# Patient Record
Sex: Male | Born: 1965 | Race: Black or African American | Hispanic: No | Smoking: Current every day smoker
Health system: Southern US, Community
[De-identification: ages and names within clinical notes are randomized; demographics above are authoritative.]

## PROBLEM LIST (undated history)

## (undated) HISTORY — PX: STOMACH SURGERY: SHX791

---

## 2005-09-04 ENCOUNTER — Inpatient Hospital Stay (HOSPITAL_COMMUNITY): Admission: EM | Admit: 2005-09-04 | Discharge: 2005-09-10 | Payer: Self-pay | Admitting: Emergency Medicine

## 2014-02-06 ENCOUNTER — Ambulatory Visit (HOSPITAL_COMMUNITY): Payer: Managed Care, Other (non HMO) | Attending: Family Medicine

## 2014-02-06 ENCOUNTER — Emergency Department (HOSPITAL_COMMUNITY)
Admission: EM | Admit: 2014-02-06 | Discharge: 2014-02-06 | Disposition: A | Discharge status: Home / Self Care | Payer: Managed Care, Other (non HMO) | Source: Home / Self Care | Attending: Family Medicine | Admitting: Family Medicine

## 2014-02-06 ENCOUNTER — Encounter (HOSPITAL_COMMUNITY): Payer: Self-pay | Admitting: Emergency Medicine

## 2014-02-06 DIAGNOSIS — M5489 Other dorsalgia: Secondary | ICD-10-CM | POA: Diagnosis present

## 2014-02-06 DIAGNOSIS — R091 Pleurisy: Secondary | ICD-10-CM

## 2014-02-06 DIAGNOSIS — J918 Pleural effusion in other conditions classified elsewhere: Secondary | ICD-10-CM

## 2014-02-06 MED ORDER — DICLOFENAC POTASSIUM 50 MG PO TABS: 50.0000 mg | ORAL_TABLET | Freq: Three times a day (TID) | ORAL | Status: DC

## 2014-02-06 NOTE — Discharge Instructions (Signed)
Use medicine as needed, return or see your doctor if further problems.

## 2014-02-06 NOTE — ED Provider Notes (Signed)
CSN: [id number]     Arrival date & time 02/06/14  1624 History   First MD Initiated Contact with Patient 02/06/14 1653     Chief Complaint  Patient presents with  . Chest Pain   (Consider location/radiation/quality/duration/timing/severity/associated sxs/prior Treatment) Patient is a 48 y.o. male presenting with chest pain. The history is provided by the patient.  Chest Pain Pain location:  R lateral chest Pain quality: sharp   Pain radiates to:  Mid back Pain radiates to the back: yes   Pain severity:  Moderate Onset quality:  Sudden Duration:  3 days Progression:  Unchanged Chronicity:  New Context: breathing   Relieved by:  None tried Worsened by:  Nothing tried Ineffective treatments:  None tried Associated symptoms: cough   Associated symptoms: no abdominal pain, no lower extremity edema, no palpitations, no PND and no shortness of breath   Risk factors: male sex and smoking     History reviewed. No pertinent past medical history. History reviewed. No pertinent past surgical history. History reviewed. No pertinent family history. History  Substance Use Topics  . Smoking status: Current Every Day Smoker -- 1.00 packs/day    Types: Cigarettes  . Smokeless tobacco: Not on file  . Alcohol Use: 1.2 - 1.8 oz/week    2-3 Cans of beer per week     Comment: daily    Review of Systems  Constitutional: Negative.   Respiratory: Positive for cough. Negative for chest tightness, shortness of breath and wheezing.   Cardiovascular: Positive for chest pain. Negative for palpitations, leg swelling and PND.  Gastrointestinal: Negative for abdominal pain.    Allergies  Review of patient's allergies indicates no known allergies.  Home Medications   Prior to Admission medications   Medication Sig Start Date End Date Taking? Authorizing Provider  diclofenac (CATAFLAM) 50 MG tablet Take 1 tablet (50 mg total) by mouth 3 (three) times daily. For chest pain 02/06/14   Billy Fischer, MD   BP 99/70 mmHg  Pulse 72  Temp(Src) 99.8 F (37.7 C) (Oral)  Resp 18  SpO2 99% Physical Exam  Constitutional: He is oriented to person, place, and time. He appears well-developed and well-nourished. No distress.  Neck: Normal range of motion. Neck supple.  Cardiovascular: Normal rate, regular rhythm, normal heart sounds and intact distal pulses.   Pulmonary/Chest: Effort normal and breath sounds normal. No respiratory distress. He has no wheezes. He exhibits no tenderness.  Abdominal: Soft. Bowel sounds are normal. He exhibits no distension. There is no tenderness. There is no rebound and no guarding.  Lymphadenopathy:    He has no cervical adenopathy.  Neurological: He is alert and oriented to person, place, and time.  Skin: Skin is warm and dry.  Nursing note and vitals reviewed.   ED Course  Procedures (including critical care time) Labs Review Labs Reviewed - No data to display  Imaging Review Dg Chest 2 View  02/06/2014   CLINICAL DATA:  Right upper back pain for 3 days, initial encounter  EXAM: CHEST  2 VIEW  COMPARISON:  None.  FINDINGS: The heart size and mediastinal contours are within normal limits. Both lungs are clear. The visualized skeletal structures are unremarkable.  IMPRESSION: No active cardiopulmonary disease.   Electronically Signed   By: Inez Catalina M.D.   On: 02/06/2014 17:48   X-rays reviewed and report per radiologist.   MDM   1. Viral pleurisy        Billy Fischer, MD 02/06/14  1801 

## 2014-02-06 NOTE — ED Notes (Signed)
Pt states that he has had a difficult time breathing on the right side of his chest for 3 days.

## 2014-02-14 ENCOUNTER — Other Ambulatory Visit (HOSPITAL_COMMUNITY): Payer: Managed Care, Other (non HMO)

## 2014-02-14 ENCOUNTER — Emergency Department (HOSPITAL_COMMUNITY): Payer: Managed Care, Other (non HMO)

## 2014-02-14 ENCOUNTER — Encounter (HOSPITAL_COMMUNITY): Payer: Self-pay | Admitting: *Deleted

## 2014-02-14 ENCOUNTER — Inpatient Hospital Stay (HOSPITAL_COMMUNITY)
Admission: EM | Admit: 2014-02-14 | Discharge: 2014-02-16 | DRG: 194 | Disposition: A | Discharge status: Home / Self Care | Payer: Managed Care, Other (non HMO) | Attending: Internal Medicine | Admitting: Internal Medicine

## 2014-02-14 DIAGNOSIS — J90 Pleural effusion, not elsewhere classified: Secondary | ICD-10-CM

## 2014-02-14 DIAGNOSIS — J189 Pneumonia, unspecified organism: Principal | ICD-10-CM | POA: Diagnosis present

## 2014-02-14 DIAGNOSIS — F17210 Nicotine dependence, cigarettes, uncomplicated: Secondary | ICD-10-CM | POA: Diagnosis present

## 2014-02-14 DIAGNOSIS — R0789 Other chest pain: Secondary | ICD-10-CM

## 2014-02-14 DIAGNOSIS — R079 Chest pain, unspecified: Secondary | ICD-10-CM

## 2014-02-14 DIAGNOSIS — R7989 Other specified abnormal findings of blood chemistry: Secondary | ICD-10-CM

## 2014-02-14 DIAGNOSIS — R05 Cough: Secondary | ICD-10-CM | POA: Diagnosis not present

## 2014-02-14 DIAGNOSIS — J9811 Atelectasis: Secondary | ICD-10-CM | POA: Diagnosis present

## 2014-02-14 DIAGNOSIS — R918 Other nonspecific abnormal finding of lung field: Secondary | ICD-10-CM | POA: Insufficient documentation

## 2014-02-14 DIAGNOSIS — I2510 Atherosclerotic heart disease of native coronary artery without angina pectoris: Secondary | ICD-10-CM | POA: Diagnosis present

## 2014-02-14 LAB — URINALYSIS, ROUTINE W REFLEX MICROSCOPIC
Bilirubin Urine: NEGATIVE
Glucose, UA: NEGATIVE mg/dL
HGB URINE DIPSTICK: NEGATIVE
Ketones, ur: NEGATIVE mg/dL
Nitrite: NEGATIVE
Protein, ur: NEGATIVE mg/dL
SPECIFIC GRAVITY, URINE: 1.038 — AB (ref 1.005–1.030)
UROBILINOGEN UA: 2 mg/dL — AB (ref 0.0–1.0)
pH: 6 (ref 5.0–8.0)

## 2014-02-14 LAB — BASIC METABOLIC PANEL
ANION GAP: 12 (ref 5–15)
BUN: 8 mg/dL (ref 6–23)
CALCIUM: 9.2 mg/dL (ref 8.4–10.5)
CO2: 25 mEq/L (ref 19–32)
Chloride: 101 mEq/L (ref 96–112)
Creatinine, Ser: 0.85 mg/dL (ref 0.50–1.35)
GFR calc Af Amer: >90 mL/min (ref >90)
Glucose, Bld: 132 mg/dL — ABNORMAL HIGH (ref 70–99)
POTASSIUM: 3.9 meq/L (ref 3.7–5.3)
SODIUM: 138 meq/L (ref 137–147)

## 2014-02-14 LAB — RAPID URINE DRUG SCREEN, HOSP PERFORMED
AMPHETAMINES: NOT DETECTED
Barbiturates: NOT DETECTED
Benzodiazepines: NOT DETECTED
Cocaine: NOT DETECTED
OPIATES: NOT DETECTED
Tetrahydrocannabinol: NOT DETECTED

## 2014-02-14 LAB — TROPONIN I

## 2014-02-14 LAB — DIFFERENTIAL
Basophils Absolute: 0 10*3/uL (ref 0.0–0.1)
Basophils Relative: 0 % (ref 0–1)
EOS ABS: 0.3 10*3/uL (ref 0.0–0.7)
Eosinophils Relative: 5 % (ref 0–5)
LYMPHS ABS: 1 10*3/uL (ref 0.7–4.0)
Lymphocytes Relative: 17 % (ref 12–46)
MONOS PCT: 8 % (ref 3–12)
Monocytes Absolute: 0.5 10*3/uL (ref 0.1–1.0)
NEUTROS ABS: 4 10*3/uL (ref 1.7–7.7)
NEUTROS PCT: 70 % (ref 43–77)

## 2014-02-14 LAB — D-DIMER, QUANTITATIVE (NOT AT ARMC): D DIMER QUANT: 1.56 ug{FEU}/mL — AB (ref 0.00–0.48)

## 2014-02-14 LAB — CBC
HEMATOCRIT: 40.5 % (ref 39.0–52.0)
HEMOGLOBIN: 13.6 g/dL (ref 13.0–17.0)
MCH: 32.3 pg (ref 26.0–34.0)
MCHC: 33.6 g/dL (ref 30.0–36.0)
MCV: 96.2 fL (ref 78.0–100.0)
Platelets: 231 10*3/uL (ref 150–400)
RBC: 4.21 MIL/uL — ABNORMAL LOW (ref 4.22–5.81)
RDW: 12.9 % (ref 11.5–15.5)
WBC: 5.6 10*3/uL (ref 4.0–10.5)

## 2014-02-14 LAB — URINE MICROSCOPIC-ADD ON

## 2014-02-14 LAB — PROCALCITONIN: Procalcitonin: 0.5 ng/mL

## 2014-02-14 LAB — STREP PNEUMONIAE URINARY ANTIGEN: STREP PNEUMO URINARY ANTIGEN: NEGATIVE

## 2014-02-14 LAB — I-STAT TROPONIN, ED: Troponin i, poc: 0 ng/mL (ref 0.00–0.08)

## 2014-02-14 LAB — HIV ANTIBODY (ROUTINE TESTING W REFLEX): HIV: NONREACTIVE

## 2014-02-14 IMAGING — CT CT ANGIO CHEST
1 of 8 series · 17 of 36 positions shown · IV contrast (Iohexol (Omnipaque 350))
Comparison: Chest x-ray [DATE], [DATE]

CLINICAL DATA: Right-sided chest pain and back pain

EXAM:
CT ANGIOGRAPHY CHEST WITH CONTRAST
TECHNIQUE: Multidetector CT imaging of the chest was performed using the
standard protocol during bolus administration of intravenous
contrast. Multiplanar CT image reconstructions and MIPs were
obtained to evaluate the vascular anatomy.
CONTRAST:  80mL OMNIPAQUE IOHEXOL 350 MG/ML SOLN

[Series 407: thins pacs · axial · 0.67mm/px · z∈[+34,+303]mm · 17 of 303 slices shown]
[im 17/303  lung]
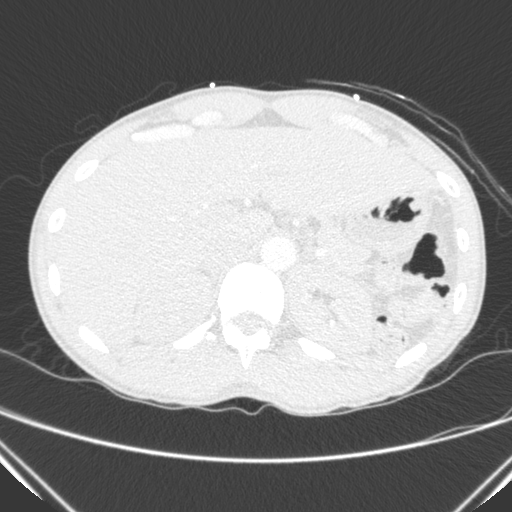
[im 34/303  mediastinal]
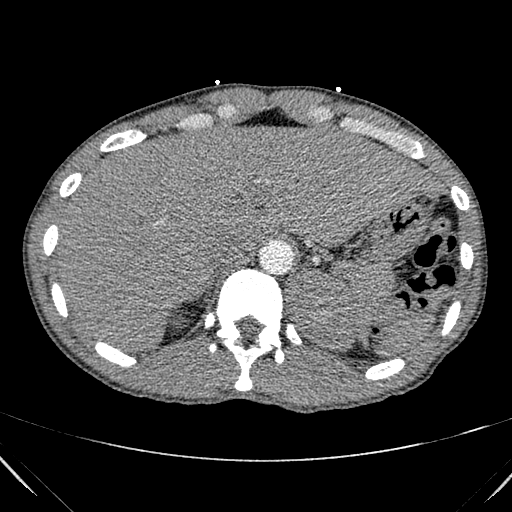
[im 51/303  lung]
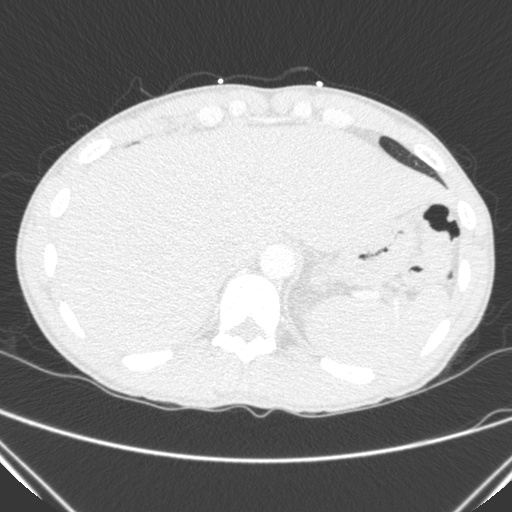
[im 68/303  mediastinal]
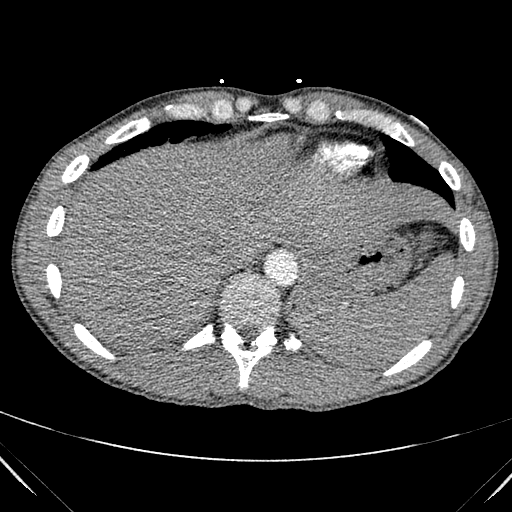
[im 84/303  lung]
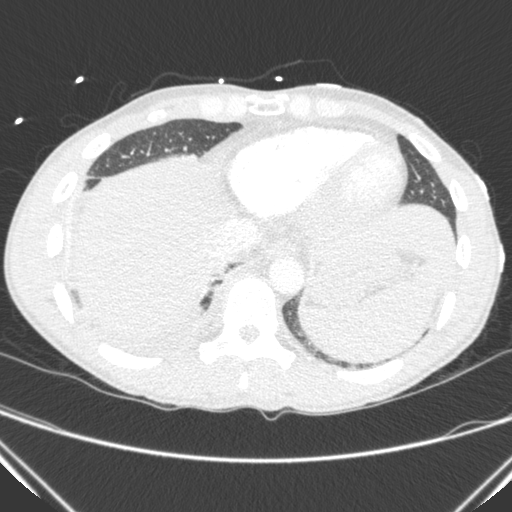
[im 101/303  mediastinal]
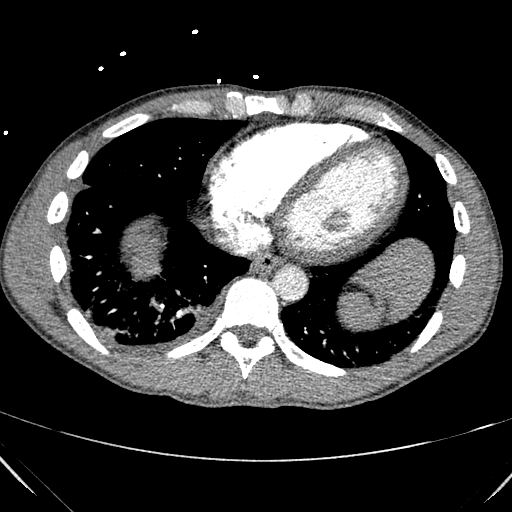
[im 118/303  lung]
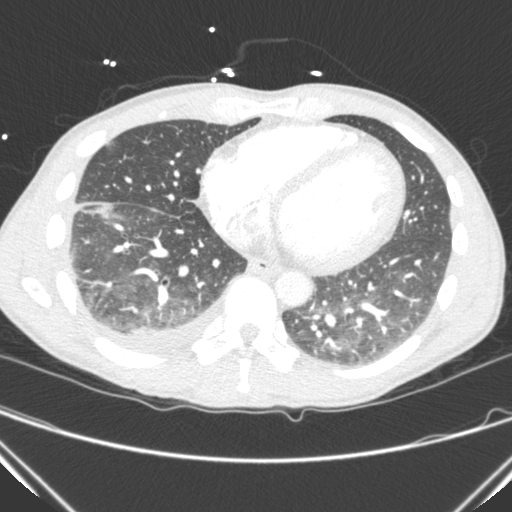
[im 135/303  mediastinal]
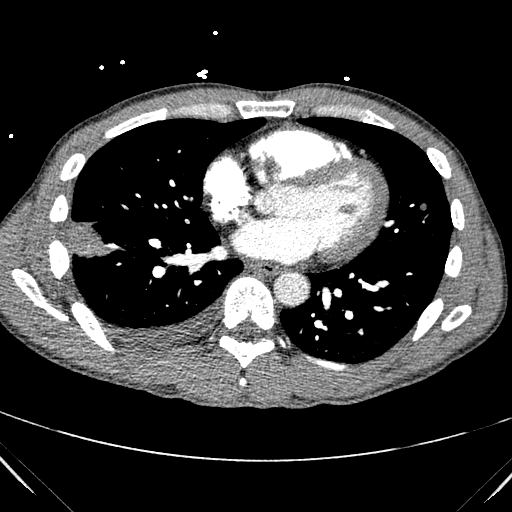
[im 152/303  lung]
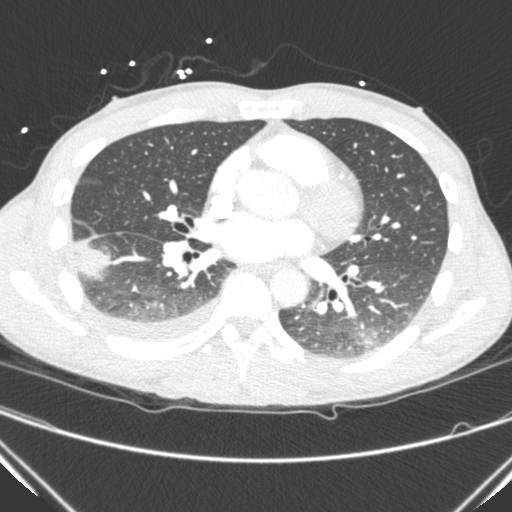
[im 168/303  mediastinal]
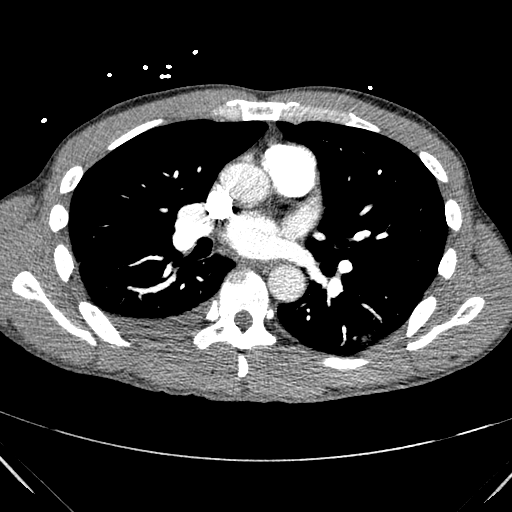
[im 185/303  lung]
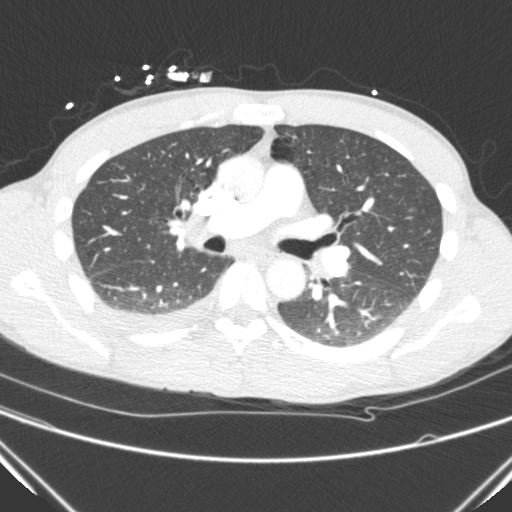
[im 202/303  mediastinal]
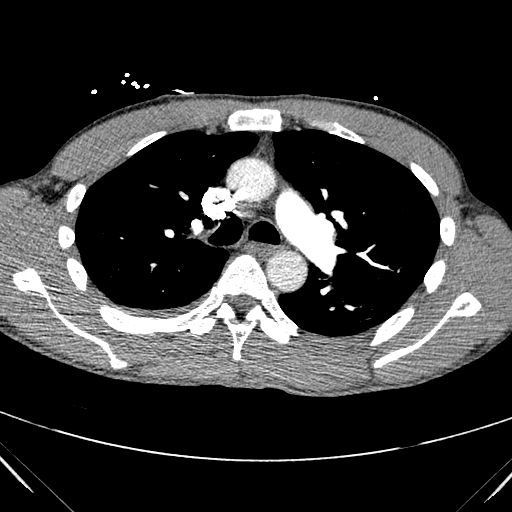
[im 219/303  lung]
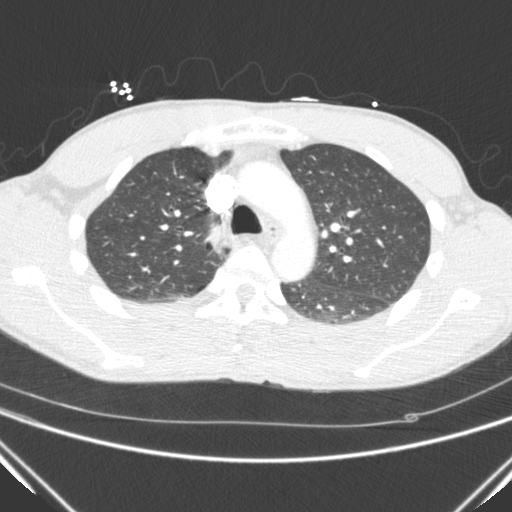
[im 235/303  mediastinal]
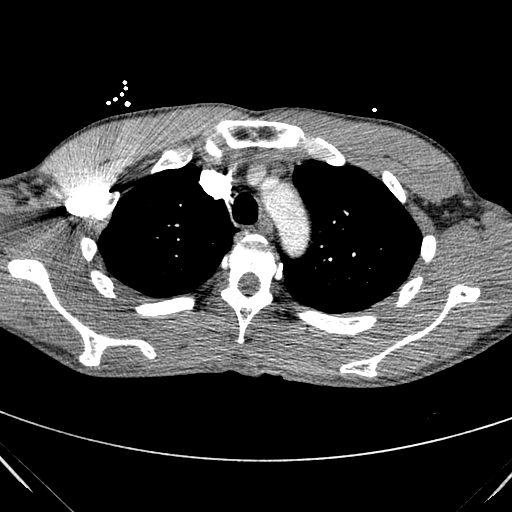
[im 252/303  lung]
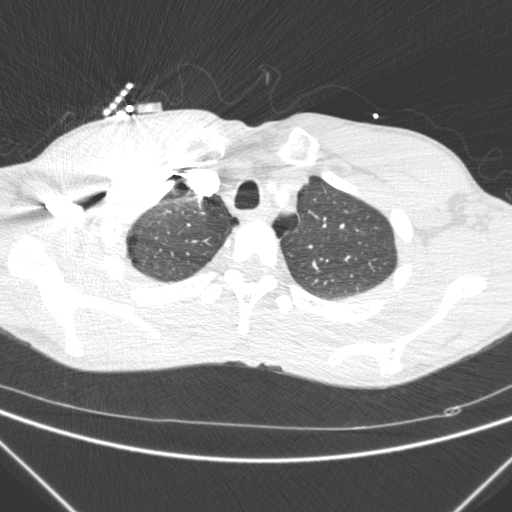
[im 269/303  mediastinal]
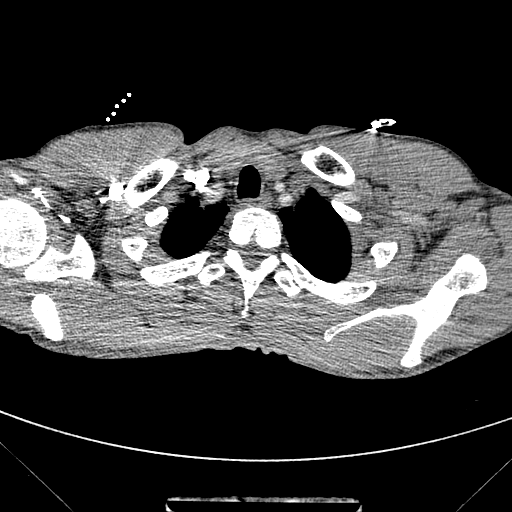
[im 286/303  lung]
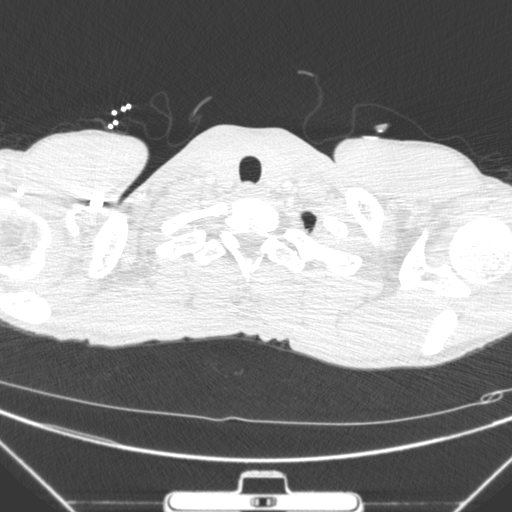

[17 of 36 positions shown; findings below may reference images not displayed]

FINDINGS: There is adequate opacification of the pulmonary arteries. There is
no pulmonary embolus. The main pulmonary artery, right main
pulmonary artery and left main pulmonary arteries are normal in
size. The heart size is normal. There is no pericardial effusion.
Mild LAD atherosclerosis.

Small right pleural effusion. 2.5 x 2.8 cm area of nodular airspace
disease in the superior segment of the right lower lobe with Ace
similar adjacent abnormality slightly inferiorly measuring 2.5 x
cm. There is a 1.5 x 1.5 cm nodular area of airspace disease in the
left lower lobe peripherally. There is bibasilar atelectasis. There
is no pneumothorax. There are biapical blebs.

There is no axillary, hilar, or mediastinal adenopathy.

There is no lytic or blastic osseous lesion.

The visualized portions of the upper abdomen are unremarkable.

Review of the MIP images confirms the above findings.
IMPRESSION: 1. No evidence of pulmonary embolus.
2. There are 2 areas of nodular airspace disease in the superior
segment of the right lower lobe measuring 2.5 x 2.8 cm and 2.5 x
cm respectively. There is a 1.5 x 1.5 cm nodular area of airspace
disease in the left lower lobe. The overall appearance is concerning
for an infectious etiology such as multilobar pneumonia versus
septic emboli versus fungal infection. Malignancy is considered much
less likely given that radiographically this has appeared to have
rapidly progressed compared with [DATE]. Pulmonary Medicine
consultation recommended.

## 2014-02-14 MED ORDER — IOHEXOL 350 MG/ML SOLN
80.0000 mL | Freq: Once | INTRAVENOUS | Status: AC | PRN
  Administered 2014-02-14: 80 mL via INTRAVENOUS

## 2014-02-14 MED ORDER — KETOROLAC TROMETHAMINE 30 MG/ML IJ SOLN
30.0000 mg | Freq: Once | INTRAMUSCULAR | Status: AC
  Administered 2014-02-14: 30 mg via INTRAVENOUS
  Filled 2014-02-14: qty 1

## 2014-02-14 MED ORDER — DEXTROSE 5 % IV SOLN
1.0000 g | Freq: Once | INTRAVENOUS | Status: AC
  Administered 2014-02-14: 1 g via INTRAVENOUS
  Filled 2014-02-14: qty 10

## 2014-02-14 MED ORDER — DEXTROSE 5 % IV SOLN
500.0000 mg | Freq: Once | INTRAVENOUS | Status: AC
  Administered 2014-02-14: 500 mg via INTRAVENOUS
  Filled 2014-02-14: qty 500

## 2014-02-14 MED ORDER — PNEUMOCOCCAL VAC POLYVALENT 25 MCG/0.5ML IJ INJ
0.5000 mL | INJECTION | INTRAMUSCULAR | Status: DC
  Filled 2014-02-14: qty 0.5

## 2014-02-14 MED ORDER — AZITHROMYCIN 500 MG IV SOLR
500.0000 mg | INTRAVENOUS | Status: DC
  Administered 2014-02-15: 500 mg via INTRAVENOUS
  Filled 2014-02-14: qty 500

## 2014-02-14 MED ORDER — HEPARIN SODIUM (PORCINE) 5000 UNIT/ML IJ SOLN
5000.0000 [IU] | Freq: Three times a day (TID) | INTRAMUSCULAR | Status: DC
  Filled 2014-02-14 (×2): qty 1

## 2014-02-14 MED ORDER — IBUPROFEN 600 MG PO TABS
600.0000 mg | ORAL_TABLET | Freq: Four times a day (QID) | ORAL | Status: DC | PRN
  Administered 2014-02-15 – 2014-02-16 (×3): 600 mg via ORAL
  Filled 2014-02-14 (×4): qty 1

## 2014-02-14 MED ORDER — IBUPROFEN 600 MG PO TABS
600.0000 mg | ORAL_TABLET | Freq: Four times a day (QID) | ORAL | Status: DC | PRN
  Filled 2014-02-14 (×3): qty 1

## 2014-02-14 MED ORDER — ENOXAPARIN SODIUM 40 MG/0.4ML ~~LOC~~ SOLN
40.0000 mg | Freq: Every day | SUBCUTANEOUS | Status: DC
  Filled 2014-02-14 (×3): qty 0.4

## 2014-02-14 MED ORDER — INFLUENZA VAC SPLIT QUAD 0.5 ML IM SUSY
0.5000 mL | PREFILLED_SYRINGE | INTRAMUSCULAR | Status: AC
  Administered 2014-02-15: 0.5 mL via INTRAMUSCULAR
  Filled 2014-02-14: qty 0.5

## 2014-02-14 MED ORDER — ACETAMINOPHEN 500 MG PO TABS
500.0000 mg | ORAL_TABLET | Freq: Four times a day (QID) | ORAL | Status: DC | PRN
  Administered 2014-02-15 (×2): 500 mg via ORAL
  Filled 2014-02-14 (×2): qty 1

## 2014-02-14 MED ORDER — CEFTRIAXONE SODIUM IN DEXTROSE 20 MG/ML IV SOLN
1.0000 g | INTRAVENOUS | Status: DC
  Administered 2014-02-15: 1 g via INTRAVENOUS
  Filled 2014-02-14: qty 50

## 2014-02-14 MED ORDER — ACETAMINOPHEN 325 MG PO TABS
650.0000 mg | ORAL_TABLET | ORAL | Status: DC | PRN
  Administered 2014-02-14: 650 mg via ORAL
  Filled 2014-02-14: qty 2

## 2014-02-14 NOTE — ED Notes (Signed)
Patient transported to CT 

## 2014-02-14 NOTE — Progress Notes (Signed)
Akiel Fennell [id number] Admission Data: 02/14/2014 3:07 PM Attending Provider: Axel Filler, MD  PCP:No primary care provider on file. Consults/ Treatment Team:    Gerod Caligiuri is a 48 y.o. male patient admitted from ED awake, alert  & orientated  X 3,  Full Code, VSS - Blood pressure 138/64, pulse 102, temperature 98.3 F (36.8 C), temperature source Oral, resp. rate 18, height 6' (1.829 m), weight 73 kg (160 lb 15 oz), SpO2 100 %., no c/o shortness of breath, no c/o chest pain, no distress noted. Tele # 20 placed.   IV site WDL:  May refer to [facility]. Allergies:  No Known Allergies   History reviewed. No pertinent past medical history.    Pt orientation to unit, room and routine. Information packet given to patient/family and safety video watched.  Admission INP armband ID verified with patient/family, and in place. SR up x 2, fall risk assessment complete with Patient and family verbalizing understanding of risks associated with falls. Pt verbalizes an understanding of how to use the call bell and to call for help before getting out of bed.  Skin, clean-dry- intact without evidence of bruising, or skin tears.   No evidence of skin break down noted on exam.     Will cont to monitor and assist as needed.  Dayle Points, RN 02/14/2014 3:07 PM

## 2014-02-14 NOTE — ED Notes (Signed)
Pt reports [facility] for right side chest pain, increases with movement. Was given prescription for pain meds which only temporarily helps his pain. Denies recent cough. ekg done and no distress noted.

## 2014-02-14 NOTE — H&P (Signed)
Date: 02/14/2014               Patient Name:  Christian Marks MRN: [id number]  DOB: 1965/05/08 Age / Sex: 48 y.o., male   PCP: No primary care provider on file.         Medical Service: Internal Medicine Teaching Service         Attending Physician: Dr. Axel Filler, MD    First Contact: Dr. Hulen Luster Pager: [phone]  Second Contact: Dr. Gordy Levan Pager: [phone]       After Hours (After 5p/  First Contact Pager: [phone]  weekends / holidays): Second Contact Pager: [phone]   Chief Complaint: chest pain  History of Present Illness: Pt is a 48 y/o male w/ no PMHx (has not seen a PCP in 10 years) who presents the ED with rt sided chest pain. Chest pain begins at his rt shoulder and radiates to his rt chest wall and axial region. Pain is sharp and constant. Pain is exacerbating by movement and does not improve w/ laying still. He was seen at urgent care 1 week ago for rt shoulder pain only. He was given diclofenac for pain and d/c'd home. Pt states diclofenac helped resolve rt shoulder pain but then 2 days later he noticed that after pain meds wore off he was still having significant amt of pain, as well as pain was now located over chest wall. He endorses chills and NS. Denies sick contacts, recent travel, n/v, rashes, fevers, significant weight loss, and IVDU. He states he has some coughing with sputum production yesterday. He smokes 1PPD for the past 30 years, drinks around 3 beers per day- last drink was 1 beer yesterday, and denies family hx of cancer. 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   Meds: Current Facility-Administered Medications  Medication Dose Route Frequency Provider Last Rate Last Dose  . acetaminophen (TYLENOL) tablet 650 mg  650 mg Oral Q4H PRN Jones Bales, MD   650 mg at 02/14/14 1544  . [START ON 02/15/2014] azithromycin (ZITHROMAX) 500 mg in dextrose 5 % 250 mL IVPB  500 mg Intravenous Q24H Jones Bales, MD      . Derrill Memo ON 02/15/2014]  cefTRIAXone (ROCEPHIN) 1 g in dextrose 5 % 50 mL IVPB - Premix  1 g Intravenous Q24H Jones Bales, MD      . heparin injection 5,000 Units  5,000 Units Subcutaneous 3 times per day Jones Bales, MD   5,000 Units at 02/14/14 1400    Allergies: Allergies as of 02/14/2014  . (No Known Allergies)   History reviewed. No pertinent past medical history. History reviewed. No pertinent past surgical history. History reviewed. No pertinent family history. History   Social History  . Marital Status: Legally Separated    Spouse Name: N/A    Number of Children: N/A  . Years of Education: N/A   Occupational History  . Not on file.   Social History Main Topics  . Smoking status: Current Every Day Smoker -- 1.00 packs/day    Types: Cigarettes  . Smokeless tobacco: Not on file  . Alcohol Use: 1.2 - 1.8 oz/week    2-3 Cans of beer per week     Comment: daily  . Drug Use: No  . Sexual Activity: Not on file   Other Topics Concern  . Not on file   Social History Narrative    Review of Systems: Pertinent items are noted in HPI.  Physical Exam: Blood pressure 138/64, pulse 102, temperature 98.3 F (36.8 C), temperature source Oral, resp. rate 18, height 6' (1.829 m), weight 160 lb 15 oz (73 kg), SpO2 100 %. General: NAD, appears stated age, thin HEENT: moist mucous membranes, no tongue deviation Lungs: decrease breath sounds on RLL, no wheezing Cardiac: no murmurs, RRR GI: active bowel sounds, non tender to palpation MSK: tender to palpation of rt axillary region around 8th rib Ext: no pedal edema  Lab results: Basic Metabolic Panel:  Recent Labs  02/14/14 0738  NA 138  K 3.9  CL 101  CO2 25  GLUCOSE 132*  BUN 8  CREATININE 0.85  CALCIUM 9.2   CBC:  Recent Labs  02/14/14 0738  WBC 5.6  HGB 13.6  HCT 40.5  MCV 96.2  PLT 231   D-Dimer:  Recent Labs  02/14/14 0830  DDIMER 1.56*    Imaging results:  Dg Chest 2 View  02/14/2014   CLINICAL DATA:   Chest pain and cough for 1 week  EXAM: CHEST  2 VIEW  COMPARISON:  February 06, 2014  FINDINGS: There is patchy infiltrate in the right lower lobe with minimal right effusion. Lungs elsewhere clear. Heart size and pulmonary vascularity are normal. No adenopathy. No bone lesions.  IMPRESSION: Patchy right lower lobe infiltrate with minimal right effusion. Lungs elsewhere clear.   Electronically Signed   By: Lowella Grip M.D.   On: 02/14/2014 08:40   Ct Angio Chest Pe W/cm &/or Wo Cm  02/14/2014   CLINICAL DATA:  Right-sided chest pain and back pain  EXAM: CT ANGIOGRAPHY CHEST WITH CONTRAST  TECHNIQUE: Multidetector CT imaging of the chest was performed using the standard protocol during bolus administration of intravenous contrast. Multiplanar CT image reconstructions and MIPs were obtained to evaluate the vascular anatomy.  CONTRAST:  14mL OMNIPAQUE IOHEXOL 350 MG/ML SOLN  COMPARISON:  Chest x-ray 01/2014, 02/14/2014  FINDINGS: There is adequate opacification of the pulmonary arteries. There is no pulmonary embolus. The main pulmonary artery, right main pulmonary artery and left main pulmonary arteries are normal in size. The heart size is normal. There is no pericardial effusion. Mild LAD atherosclerosis.  Small right pleural effusion. 2.5 x 2.8 cm area of nodular airspace disease in the superior segment of the right lower lobe with Ace similar adjacent abnormality slightly inferiorly measuring 2.5 x 2.3 cm. There is a 1.5 x 1.5 cm nodular area of airspace disease in the left lower lobe peripherally. There is bibasilar atelectasis. There is no pneumothorax. There are biapical blebs.  There is no axillary, hilar, or mediastinal adenopathy.  There is no lytic or blastic osseous lesion.  The visualized portions of the upper abdomen are unremarkable.  Review of the MIP images confirms the above findings.  IMPRESSION: 1. No evidence of pulmonary embolus. 2. There are 2 areas of nodular airspace disease in the  superior segment of the right lower lobe measuring 2.5 x 2.8 cm and 2.5 x 2.3 cm respectively. There is a 1.5 x 1.5 cm nodular area of airspace disease in the left lower lobe. The overall appearance is concerning for an infectious etiology such as multilobar pneumonia versus septic emboli versus fungal infection. Malignancy is considered much less likely given that radiographically this has appeared to have rapidly progressed compared with 02/06/2014. Pulmonary Medicine consultation recommended.   Electronically Signed   By: Kathreen Devoid   On: 02/14/2014 10:19    EKG: normal sinus rhythm, no ST elevations, no previous  EKG to compare to  Assessment & Plan by Problem: Active Problems:   Community acquired pneumonia  Right lower lobe CAP-- CTA of chest reveals nodules in RLL and LLL concerning for infectious etiology such as PNA, septic emboli, or fungal infxn. Recommened consulting pulmonology for follow up. D. Dimer 1.56, pro calcitonin 0.5. Pt started on azithromycin in the ED. No leukocytosis, pt afebrile. Pain could also be due to shingles as he was exquisitely tender to light touch at midaxillary region although he has no rash. Could also be a workers pneumoconiosis such as berylliosis due to metal saw dust exposure. Could consider beryllium lymphocyte proliferation test to further investigate.  - continue azithromycin and rocephin IV -  ECHO to further investigate nodules - blood cultures pending - sputum culture needs to be collected.  - legionella, strep pneumo urinary antigen, and respiratory viral panel pending - UDS and UA ordered - IV toradol 30mg  given one, tylenol 650mg  q4h prn for pain  DVT prophylaxis-- Hep Goff  Dispo: Disposition is deferred at this time, awaiting improvement of current medical problems. Anticipated discharge in approximately 1-2 day(s).   The patient does not have a current PCP (No primary care provider on file.) and does not need an [facility] hospital follow-up  appointment after discharge.  The patient does not have transportation limitations that hinder transportation to clinic appointments.  Signed: Julious Oka, MD 02/14/2014, 4:28 PM

## 2014-02-14 NOTE — ED Notes (Signed)
Attempted report 

## 2014-02-14 NOTE — Progress Notes (Signed)
eLink Physician-Brief Progress Note Patient Name: Christian Marks DOB: [birth year] MRN: [id number]   Date of Service  02/14/2014  HPI/Events of Note  48 yo male with CAP/Multilobar pneumonia. PCCM consulted for abnormal CT Chest findings. Multiple nodule pulmonary opacities  eICU Interventions  Recs - cont with CAP tx - check ECHO - f\u Bld\sputum culture - UDS negative - full consult note to follow in the AM      Intervention Category Evaluation Type: New Patient Evaluation  Tyrina Hines 02/14/2014, 9:45 PM

## 2014-02-14 NOTE — ED Provider Notes (Signed)
CSN: [id number]     Arrival date & time 02/14/14  0714 History   First MD Initiated Contact with Patient 02/14/14 [id number]     Chief Complaint  Patient presents with  . Chest Pain     (Consider location/radiation/quality/duration/timing/severity/associated sxs/prior Treatment) Patient is a 48 y.o. male presenting with chest pain. The history is provided by the patient.  Chest Pain Pain location:  R lateral chest Pain quality: sharp   Pain radiates to:  Does not radiate Pain radiates to the back: no   Pain severity:  Moderate Onset quality:  Gradual Duration:  12 days Timing:  Constant Progression:  Unchanged Chronicity:  New Context: at rest   Relieved by: Diclofenac tablets. Exacerbated by: Diclofenac wearing off. Ineffective treatments:  None tried Associated symptoms: no abdominal pain, no fever and not vomiting     History reviewed. No pertinent past medical history. History reviewed. No pertinent past surgical history. History reviewed. No pertinent family history. History  Substance Use Topics  . Smoking status: Current Every Day Smoker -- 1.00 packs/day    Types: Cigarettes  . Smokeless tobacco: Not on file  . Alcohol Use: 1.2 - 1.8 oz/week    2-3 Cans of beer per week     Comment: daily    Review of Systems  Constitutional: Negative for fever.  Cardiovascular: Positive for chest pain.  Gastrointestinal: Negative for vomiting and abdominal pain.  All other systems reviewed and are negative.     Allergies  Review of patient's allergies indicates no known allergies.  Home Medications   Prior to Admission medications   Medication Sig Start Date End Date Taking? Authorizing Provider  diclofenac (CATAFLAM) 50 MG tablet Take 1 tablet (50 mg total) by mouth 3 (three) times daily. For chest pain 02/06/14  Yes Billy Fischer, MD  ibuprofen (ADVIL,MOTRIN) 600 MG tablet Take 600-1,200 mg by mouth every 8 (eight) hours as needed (pain).   Yes Historical Provider, MD    BP 112/63 mmHg  Pulse 96  Temp(Src) 97.2 F (36.2 C) (Oral)  Resp 26  Ht 6' (1.829 m)  Wt 160 lb (72.576 kg)  BMI 21.70 kg/m2  SpO2 100% Physical Exam  Constitutional: He is oriented to person, place, and time. He appears well-developed and well-nourished. No distress.  HENT:  Head: Normocephalic and atraumatic.  Mouth/Throat: No oropharyngeal exudate.  Eyes: EOM are normal. Pupils are equal, round, and reactive to light.  Neck: Normal range of motion. Neck supple.  Cardiovascular: Normal rate and regular rhythm.  Exam reveals no friction rub.   No murmur heard. Pulmonary/Chest: Effort normal and breath sounds normal. No respiratory distress. He has no decreased breath sounds. He has no wheezes. He has no rales. He exhibits tenderness (R posterior, below scapula).  Abdominal: He exhibits no distension. There is no tenderness. There is no rebound.  Musculoskeletal: Normal range of motion. He exhibits no edema.  Neurological: He is alert and oriented to person, place, and time.  Skin: He is not diaphoretic.  Nursing note and vitals reviewed.   ED Course  Procedures (including critical care time) Labs Review Labs Reviewed  CBC - Abnormal; Notable for the following:    RBC 4.21 (*)    All other components within normal limits  BASIC METABOLIC PANEL  D-DIMER, QUANTITATIVE  I-STAT TROPOININ, ED    Imaging Review Dg Chest 2 View  02/14/2014   CLINICAL DATA:  Chest pain and cough for 1 week  EXAM: CHEST  2 VIEW  COMPARISON:  February 06, 2014  FINDINGS: There is patchy infiltrate in the right lower lobe with minimal right effusion. Lungs elsewhere clear. Heart size and pulmonary vascularity are normal. No adenopathy. No bone lesions.  IMPRESSION: Patchy right lower lobe infiltrate with minimal right effusion. Lungs elsewhere clear.   Electronically Signed   By: Lowella Grip M.D.   On: 02/14/2014 08:40   Ct Angio Chest Pe W/cm &/or Wo Cm  02/14/2014   CLINICAL DATA:   Right-sided chest pain and back pain  EXAM: CT ANGIOGRAPHY CHEST WITH CONTRAST  TECHNIQUE: Multidetector CT imaging of the chest was performed using the standard protocol during bolus administration of intravenous contrast. Multiplanar CT image reconstructions and MIPs were obtained to evaluate the vascular anatomy.  CONTRAST:  53mL OMNIPAQUE IOHEXOL 350 MG/ML SOLN  COMPARISON:  Chest x-ray 01/2014, 02/14/2014  FINDINGS: There is adequate opacification of the pulmonary arteries. There is no pulmonary embolus. The main pulmonary artery, right main pulmonary artery and left main pulmonary arteries are normal in size. The heart size is normal. There is no pericardial effusion. Mild LAD atherosclerosis.  Small right pleural effusion. 2.5 x 2.8 cm area of nodular airspace disease in the superior segment of the right lower lobe with Ace similar adjacent abnormality slightly inferiorly measuring 2.5 x 2.3 cm. There is a 1.5 x 1.5 cm nodular area of airspace disease in the left lower lobe peripherally. There is bibasilar atelectasis. There is no pneumothorax. There are biapical blebs.  There is no axillary, hilar, or mediastinal adenopathy.  There is no lytic or blastic osseous lesion.  The visualized portions of the upper abdomen are unremarkable.  Review of the MIP images confirms the above findings.  IMPRESSION: 1. No evidence of pulmonary embolus. 2. There are 2 areas of nodular airspace disease in the superior segment of the right lower lobe measuring 2.5 x 2.8 cm and 2.5 x 2.3 cm respectively. There is a 1.5 x 1.5 cm nodular area of airspace disease in the left lower lobe. The overall appearance is concerning for an infectious etiology such as multilobar pneumonia versus septic emboli versus fungal infection. Malignancy is considered much less likely given that radiographically this has appeared to have rapidly progressed compared with 02/06/2014. Pulmonary Medicine consultation recommended.   Electronically Signed    By: Kathreen Devoid   On: 02/14/2014 10:19     EKG Interpretation   Date/Time:  Sunday February 14 2014 07:19:33 EST Ventricular Rate:  93 PR Interval:  142 QRS Duration: 90 QT Interval:  348 QTC Calculation: 432 R Axis:   73 Text Interpretation:  Normal sinus rhythm Normal ECG No significant change  since last tracing Confirmed by Mingo Amber  MD, [place] ([id number]) on 02/14/2014  7:29:58 AM      MDM   Final diagnoses:  Chest wall pain    46M here with R lateral chest pain. Present for 12 days, saw urgent care, was given diclofenac. Pain improves with diclofenac, however once medicine wears off, pain returns. No improvement of pain. No fevers, cough, or hemoptysis. No N/V. Patient a smoker. Pain worse with movement, deep breaths. Exquisite tenderness over R posterior chest, below R scapula.   Since patient is a smoker and this has not improved, will check labs including D-dimer and CXR. CXR shows RLL pneumonia.  Dimer elevated, CT shows multifocal PNA, no PE. Admitted.  I have reviewed all labs and imaging and considered them in my medical decision making.     Evelina Bucy,  MD 02/15/14 2124

## 2014-02-15 DIAGNOSIS — R05 Cough: Secondary | ICD-10-CM | POA: Diagnosis present

## 2014-02-15 DIAGNOSIS — J9811 Atelectasis: Secondary | ICD-10-CM | POA: Diagnosis present

## 2014-02-15 DIAGNOSIS — J189 Pneumonia, unspecified organism: Secondary | ICD-10-CM | POA: Diagnosis present

## 2014-02-15 DIAGNOSIS — R0781 Pleurodynia: Secondary | ICD-10-CM

## 2014-02-15 DIAGNOSIS — F17210 Nicotine dependence, cigarettes, uncomplicated: Secondary | ICD-10-CM | POA: Diagnosis present

## 2014-02-15 DIAGNOSIS — I2510 Atherosclerotic heart disease of native coronary artery without angina pectoris: Secondary | ICD-10-CM | POA: Diagnosis present

## 2014-02-15 DIAGNOSIS — R911 Solitary pulmonary nodule: Secondary | ICD-10-CM

## 2014-02-15 DIAGNOSIS — R918 Other nonspecific abnormal finding of lung field: Secondary | ICD-10-CM

## 2014-02-15 LAB — CBC WITH DIFFERENTIAL/PLATELET
Basophils Absolute: 0 10*3/uL (ref 0.0–0.1)
Basophils Relative: 0 % (ref 0–1)
EOS ABS: 0.2 10*3/uL (ref 0.0–0.7)
Eosinophils Relative: 3 % (ref 0–5)
HCT: 38.6 % — ABNORMAL LOW (ref 39.0–52.0)
HEMOGLOBIN: 12.9 g/dL — AB (ref 13.0–17.0)
LYMPHS ABS: 1 10*3/uL (ref 0.7–4.0)
LYMPHS PCT: 18 % (ref 12–46)
MCH: 32 pg (ref 26.0–34.0)
MCHC: 33.4 g/dL (ref 30.0–36.0)
MCV: 95.8 fL (ref 78.0–100.0)
MONOS PCT: 12 % (ref 3–12)
Monocytes Absolute: 0.7 10*3/uL (ref 0.1–1.0)
Neutro Abs: 3.9 10*3/uL (ref 1.7–7.7)
Neutrophils Relative %: 67 % (ref 43–77)
PLATELETS: 262 10*3/uL (ref 150–400)
RBC: 4.03 MIL/uL — AB (ref 4.22–5.81)
RDW: 12.8 % (ref 11.5–15.5)
WBC: 5.8 10*3/uL (ref 4.0–10.5)

## 2014-02-15 LAB — COMPREHENSIVE METABOLIC PANEL
ALT: 17 U/L (ref 0–53)
ANION GAP: 12 (ref 5–15)
AST: 19 U/L (ref 0–37)
Albumin: 2.8 g/dL — ABNORMAL LOW (ref 3.5–5.2)
Alkaline Phosphatase: 82 U/L (ref 39–117)
BUN: 7 mg/dL (ref 6–23)
CO2: 24 meq/L (ref 19–32)
Calcium: 9.1 mg/dL (ref 8.4–10.5)
Chloride: 104 mEq/L (ref 96–112)
Creatinine, Ser: 0.85 mg/dL (ref 0.50–1.35)
GFR calc non Af Amer: >90 mL/min (ref >90)
GLUCOSE: 103 mg/dL — AB (ref 70–99)
Potassium: 4 mEq/L (ref 3.7–5.3)
SODIUM: 140 meq/L (ref 137–147)
Total Bilirubin: 0.3 mg/dL (ref 0.3–1.2)
Total Protein: 7 g/dL (ref 6.0–8.3)

## 2014-02-15 LAB — HEPATIC FUNCTION PANEL
ALK PHOS: 84 U/L (ref 39–117)
ALT: 15 U/L (ref 0–53)
AST: 18 U/L (ref 0–37)
Albumin: 2.7 g/dL — ABNORMAL LOW (ref 3.5–5.2)
Bilirubin, Direct: <0.2 mg/dL (ref 0.0–0.3)
Total Bilirubin: <0.2 mg/dL — ABNORMAL LOW (ref 0.3–1.2)
Total Protein: 6.9 g/dL (ref 6.0–8.3)

## 2014-02-15 MED ORDER — DEXTROSE 5 % IV SOLN
500.0000 mg | INTRAVENOUS | Status: DC
  Administered 2014-02-16: 500 mg via INTRAVENOUS
  Filled 2014-02-15: qty 500

## 2014-02-15 MED ORDER — CEFTRIAXONE SODIUM IN DEXTROSE 20 MG/ML IV SOLN
1.0000 g | INTRAVENOUS | Status: DC
  Filled 2014-02-15: qty 50

## 2014-02-15 NOTE — Progress Notes (Signed)
Patient ID: Christian Marks, male   DOB: 1965-05-28, 47 y.o.   MRN: [id number]    Request has been made for lung mass biopsy  Pt with no past medical hx +smoker Admitted with cough and R chest pain  Work up reveals B lung masses/consolidations T 100.1 now; 100.3 last pm [facility] wnl  Dr Annamaria Boots has reviewed imaging Feels most likely infectious  Rec:  Treatment then follow up CT Chest scan 4 weeks  Called to Dr Hulen Luster

## 2014-02-15 NOTE — Progress Notes (Signed)
Educated pt. On use of Incentive Spirometer (I-S) and its importance. Encouraged pt. To use at least 5x/hour. Pt. Was able to effectively demonstrate use and was able to reach 1500. Goal is for pt. To reach 2500. Will continue to monitor

## 2014-02-15 NOTE — Progress Notes (Signed)
Subjective: Pt continues to have rt sided chest pain exacerbated by cough. Denies sputum production or fevers. Positive for chills.   Objective: Vital signs in last 24 hours: Filed Vitals:   02/14/14 1246 02/14/14 1416 02/14/14 2251 02/15/14 0545  BP: 118/62 138/64 103/56 101/58  Pulse:  102 69 60  Temp: 98.3 F (36.8 C) 98.3 F (36.8 C) 100.3 F (37.9 C) 98.7 F (37.1 C)  TempSrc: Oral Oral Oral Oral  Resp: 18 18 22 22   Height:  6' (1.829 m)    Weight:  160 lb 15 oz (73 kg)    SpO2: 100% 100% 98% 97%   Weight change:   Intake/Output Summary (Last 24 hours) at 02/15/14 0919 Last data filed at 02/15/14 0547  Gross per 24 hour  Intake    720 ml  Output      0 ml  Net    720 ml   General: NAD, appears stated age, thin Lungs: decrease breath sounds on RLL, no wheezing Cardiac: no murmurs, RRR GI: active bowel sounds, non tender to palpation Ext: no pedal edema  Lab Results: Basic Metabolic Panel:  Recent Labs Lab 02/14/14 0738 02/15/14 0633  NA 138 140  K 3.9 4.0  CL 101 104  CO2 25 24  GLUCOSE 132* 103*  BUN 8 7  CREATININE 0.85 0.85  CALCIUM 9.2 9.1   Liver Function Tests:  Recent Labs Lab 02/15/14 0004 02/15/14 0633  AST 18 19  ALT 15 17  ALKPHOS 84 82  BILITOT <0.2* 0.3  PROT 6.9 7.0  ALBUMIN 2.7* 2.8*   CBC:  Recent Labs Lab 02/14/14 0738 02/14/14 1630 02/15/14 0633  WBC 5.6  --  5.8  NEUTROABS  --  4.0 3.9  HGB 13.6  --  12.9*  HCT 40.5  --  38.6*  MCV 96.2  --  95.8  PLT 231  --  262   Cardiac Enzymes:  Recent Labs Lab 02/14/14 1254  TROPONINI <0.30    Studies/Results: Dg Chest 2 View  02/14/2014   CLINICAL DATA:  Chest pain and cough for 1 week  EXAM: CHEST  2 VIEW  COMPARISON:  February 06, 2014  FINDINGS: There is patchy infiltrate in the right lower lobe with minimal right effusion. Lungs elsewhere clear. Heart size and pulmonary vascularity are normal. No adenopathy. No bone lesions.  IMPRESSION: Patchy right lower  lobe infiltrate with minimal right effusion. Lungs elsewhere clear.   Electronically Signed   By: Lowella Grip M.D.   On: 02/14/2014 08:40   Ct Angio Chest Pe W/cm &/or Wo Cm  02/14/2014   CLINICAL DATA:  Right-sided chest pain and back pain  EXAM: CT ANGIOGRAPHY CHEST WITH CONTRAST  TECHNIQUE: Multidetector CT imaging of the chest was performed using the standard protocol during bolus administration of intravenous contrast. Multiplanar CT image reconstructions and MIPs were obtained to evaluate the vascular anatomy.  CONTRAST:  62mL OMNIPAQUE IOHEXOL 350 MG/ML SOLN  COMPARISON:  Chest x-ray 01/2014, 02/14/2014  FINDINGS: There is adequate opacification of the pulmonary arteries. There is no pulmonary embolus. The main pulmonary artery, right main pulmonary artery and left main pulmonary arteries are normal in size. The heart size is normal. There is no pericardial effusion. Mild LAD atherosclerosis.  Small right pleural effusion. 2.5 x 2.8 cm area of nodular airspace disease in the superior segment of the right lower lobe with Ace similar adjacent abnormality slightly inferiorly measuring 2.5 x 2.3 cm. There is a 1.5 x 1.5  cm nodular area of airspace disease in the left lower lobe peripherally. There is bibasilar atelectasis. There is no pneumothorax. There are biapical blebs.  There is no axillary, hilar, or mediastinal adenopathy.  There is no lytic or blastic osseous lesion.  The visualized portions of the upper abdomen are unremarkable.  Review of the MIP images confirms the above findings.  IMPRESSION: 1. No evidence of pulmonary embolus. 2. There are 2 areas of nodular airspace disease in the superior segment of the right lower lobe measuring 2.5 x 2.8 cm and 2.5 x 2.3 cm respectively. There is a 1.5 x 1.5 cm nodular area of airspace disease in the left lower lobe. The overall appearance is concerning for an infectious etiology such as multilobar pneumonia versus septic emboli versus fungal  infection. Malignancy is considered much less likely given that radiographically this has appeared to have rapidly progressed compared with 02/06/2014. Pulmonary Medicine consultation recommended.   Electronically Signed   By: Kathreen Devoid   On: 02/14/2014 10:19   Medications: I have reviewed the patient's current medications. Scheduled Meds: . azithromycin  500 mg Intravenous Q24H  . cefTRIAXone (ROCEPHIN)  IV  1 g Intravenous Q24H  . enoxaparin (LOVENOX) injection  40 mg Subcutaneous QHS  . Influenza vac split quadrivalent PF  0.5 mL Intramuscular Tomorrow-1000  . pneumococcal 23 valent vaccine  0.5 mL Intramuscular Tomorrow-1000   Continuous Infusions:  PRN Meds:.acetaminophen, ibuprofen Assessment/Plan: Active Problems:   Community acquired pneumonia   Right lower lobe CAP-- CTA of chest reveals nodules in RLL and LLL concerning for infectious etiology such as PNA, septic emboli, or fungal infxn. D. Dimer 1.56, pro calcitonin 0.5. Pt started on azithromycin in the ED. No leukocytosis, pt afebrile. - pulmonology saw patient today, awaiting recommendations regarding lung nodule workup - continue azithromycin and rocephin IV - ECHO to further investigate nodules - blood cultures pending - sputum culture needs to be collected.  - legionella and respiratory viral panel pending - strep pneumo urinary antigen negative - UDS and UA negative - motrin 600mg  q6h prn for pain  DVT prophylaxis-- lovenox  Dispo: Disposition is deferred at this time, awaiting improvement of current medical problems. Anticipated discharge in approximately 1-2 day(s).   The patient does not have a current PCP (No primary care provider on file.) and does not need an [facility] hospital follow-up appointment after discharge.  The patient does not have transportation limitations that hinder transportation to clinic appointments .Services Needed at time of discharge: Y = Yes, Blank = No PT:   OT:   RN:   Equipment:    Other:     LOS: 1 day   Julious Oka, MD 02/15/2014, 9:19 AM

## 2014-02-15 NOTE — Progress Notes (Signed)
Rechecked pt. Temp, increased to 100.9. Spoke with on-call MD and informed him of pt. Temp. MD stated to continue with ABT and give tylenol when next dose is able to be given. Will continue to monitor vitals and temp.

## 2014-02-15 NOTE — Progress Notes (Signed)
ANTIBIOTIC CONSULT NOTE - INITIAL  Pharmacy Consult for Ceftriaxone Indication: pneumonia  No Known Allergies  Patient Measurements: Height: 6' (182.9 cm) Weight: 160 lb 15 oz (73 kg) IBW/kg (Calculated) : 77.6 Adjusted Body Weight: n/a   Vital Signs: Temp: 100.1 F (37.8 C) (12/21 1440) Temp Source: Oral (12/21 1440) BP: 109/46 mmHg (12/21 1440) Pulse Rate: 72 (12/21 1440)  Labs:  Recent Labs  02/14/14 0738 02/15/14 0633  WBC 5.6 5.8  HGB 13.6 12.9*  PLT 231 262  CREATININE 0.85 0.85   Estimated Creatinine Clearance: 109.7 mL/min (by C-G formula based on Cr of 0.85). No results for input(s): VANCOTROUGH, VANCOPEAK, VANCORANDOM, GENTTROUGH, GENTPEAK, GENTRANDOM, TOBRATROUGH, TOBRAPEAK, TOBRARND, AMIKACINPEAK, AMIKACINTROU, AMIKACIN in the last 72 hours.   Microbiology: Recent Results (from the past 720 hour(s))  Culture, blood (routine x 2)     Status: None (Preliminary result)   Collection Time: 02/14/14 11:15 AM  Result Value Ref Range Status   Specimen Description BLOOD LEFT ARM  Final   Special Requests BOTTLES DRAWN AEROBIC AND ANAEROBIC 10 CC  Final   Culture  Setup Time   Final    02/14/2014 18:31 Performed at [facility]    Culture   Final           BLOOD CULTURE RECEIVED NO GROWTH TO DATE CULTURE WILL BE HELD FOR 5 DAYS BEFORE ISSUING A FINAL NEGATIVE REPORT Performed at [facility]    Report Status PENDING  Incomplete  Culture, blood (routine x 2)     Status: None (Preliminary result)   Collection Time: 02/14/14 11:25 AM  Result Value Ref Range Status   Specimen Description BLOOD RIGHT ARM  Final   Special Requests BOTTLES DRAWN AEROBIC AND ANAEROBIC 10 CC  Final   Culture  Setup Time   Final    02/14/2014 18:30 Performed at [facility]    Culture   Final           BLOOD CULTURE RECEIVED NO GROWTH TO DATE CULTURE WILL BE HELD FOR 5 DAYS BEFORE ISSUING A FINAL NEGATIVE REPORT Performed at [facility]    Report  Status PENDING  Incomplete    Medical History: History reviewed. No pertinent past medical history.  Assessment: 48 yo male with no significant PMH admitted with R chest pain and cough.  Pharmacy asked to add empiric ceftriaxone.  Received one dose already today at 1402 pm.  Goal of Therapy:  Resolution of infection  Plan:  1. Ceftriaxone 1g IV q 24 hrs. 2. F/u cultures, length of therapy.  Uvaldo Rising, BCPS  Clinical Pharmacist Pager [phone]  02/15/2014 3:19 PM

## 2014-02-15 NOTE — Consult Note (Signed)
[facility] for Infectious Disease     Reason for Consult: ? pneumonia    Referring Physician: Dr. Marinda Elk  Active Problems:   Community acquired pneumonia   . azithromycin  500 mg Intravenous Q24H  . cefTRIAXone (ROCEPHIN)  IV  1 g Intravenous Q24H  . enoxaparin (LOVENOX) injection  40 mg Subcutaneous QHS  . pneumococcal 23 valent vaccine  0.5 mL Intramuscular Tomorrow-1000    Recommendations: CT guided biopsy for ? Cancer, also AFB, fungal stains and culture Stop antibiotics   Assessment: He has small right pleural effusion, symptoms of dry cough, pleuritic chest pain and nodules concerning for cancer.  Not c/w CAP.  Less likely fungal or mycobacterial.  CXR findings did not show 2 weeks ago but I would not suspect nodularity to be acute.      Antibiotics: Azithromycin and ceftriaxone  HPI: Christian Marks is a 48 y.o. male smoker with no signficant past medical history with two weeks of dry cough with pleuritic chest pain.  Diagnosed with viral pleurisy initially 2 weeks ago with normal CXR but came back with persistent symptoms and CXR and CT with small pleural effusion with nodules.     Review of Systems: A comprehensive review of systems was negative.  History reviewed. No pertinent past medical history.  History  Substance Use Topics  . Smoking status: Current Every Day Smoker -- 1.00 packs/day    Types: Cigarettes  . Smokeless tobacco: Not on file  . Alcohol Use: 1.2 - 1.8 oz/week    2-3 Cans of beer per week     Comment: daily    History reviewed. No pertinent family history. No Known Allergies  OBJECTIVE: Blood pressure 101/58, pulse 60, temperature 98.2 F (36.8 C), temperature source Oral, resp. rate 22, height 6' (1.829 m), weight 160 lb 15 oz (73 kg), SpO2 97 %. General: awake, alert, nad Skin: no rashes Lungs: small crackles noted at right base, diminished Cor: RRR Abdomen: soft,, nt, nd Ext: no edema  Microbiology: Recent Results  (from the past 240 hour(s))  Culture, blood (routine x 2)     Status: None (Preliminary result)   Collection Time: 02/14/14 11:15 AM  Result Value Ref Range Status   Specimen Description BLOOD LEFT ARM  Final   Special Requests BOTTLES DRAWN AEROBIC AND ANAEROBIC 10 CC  Final   Culture  Setup Time   Final    02/14/2014 18:31 Performed at [facility]    Culture   Final           BLOOD CULTURE RECEIVED NO GROWTH TO DATE CULTURE WILL BE HELD FOR 5 DAYS BEFORE ISSUING A FINAL NEGATIVE REPORT Performed at [facility]    Report Status PENDING  Incomplete  Culture, blood (routine x 2)     Status: None (Preliminary result)   Collection Time: 02/14/14 11:25 AM  Result Value Ref Range Status   Specimen Description BLOOD RIGHT ARM  Final   Special Requests BOTTLES DRAWN AEROBIC AND ANAEROBIC 10 CC  Final   Culture  Setup Time   Final    02/14/2014 18:30 Performed at [facility]    Culture   Final           BLOOD CULTURE RECEIVED NO GROWTH TO DATE CULTURE WILL BE HELD FOR 5 DAYS BEFORE ISSUING A FINAL NEGATIVE REPORT Performed at [facility]    Report Status PENDING  Incomplete    COMER, Herbie [place], [place] for Infectious  Disease [facility] [email or web address] [phone] pager  [phone] cell 02/15/2014, 2:03 PM

## 2014-02-15 NOTE — Consult Note (Signed)
Name: Christian Marks MRN: [id number] DOB: 03-Jun-1965    ADMISSION DATE:  02/14/2014 CONSULTATION DATE:  12/21  REFERRING MD :  Marinda Elk   CHIEF COMPLAINT:  Abnormal CT chest   BRIEF PATIENT DESCRIPTION:  48 year old smoker admitted on 12/20 w/ CC: right sided pleuritic CP. CT chest obtained showing bilateral pulmonary nodules. PCCM asked to evaluate.   SIGNIFICANT EVENTS    STUDIES:  CT chest 12/20: There are 2 areas of nodular airspace disease in the superior segment of the right lower lobe measuring 2.5 x 2.8 cm and 2.5 x 2.3 cm respectively. There is a 1.5 x 1.5 cm nodular area of airspace disease in the left lower lobe. RVP 12/20>> BC X 2 12/20>>  HISTORY OF PRESENT ILLNESS:   This is a 48 year old male w/ no recorded medical hx. Presented to the ER on 12/20 w/ CC: right sided chest pain. Reported as constant sharp, exacerbated by movement, deep breath and not improved w/ rest. Begins at his rt shoulder and radiates to his rt chest wall and axial region. Seen at urgent care 1 week ago for rt shoulder pain only. He was given diclofenac for pain and d/c'd home. Pt states diclofenac helped resolve rt shoulder pain but then 2 days later he noticed that after pain meds wore off he was still having significant amt of pain, as well as pain was now located over chest wall. He endorsed chills and night sweats. Denied sick contacts, recent travel, n/v, rashes, fevers, significant weight loss, and IVDU. He stated he has some coughing with sputum production on 12/19. He smokes 1PPD for the past 30 years, drinks around 3 beers per day- last drink was 1 beer 12/18, and denies family hx of cancer. 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 A CT of chest was obtained: This showed bilateral pulmonary Nodules. The larger of the two is located in the RLL measuring 2.5X2.8 cm the other in the posterior LLL measuring 1.5X 1.5cm. Both appeared pleural based.  He was admitted to the  medical service. Started on empiric CAP therapy. PCT was negative, UDS was negative, U strep was negative. PCCM was asked to see Re: abnormal CT findings   PAST MEDICAL HISTORY :   has no past medical history on file.  has no past surgical history on file. Prior to Admission medications   Medication Sig Start Date End Date Taking? Authorizing Provider  diclofenac (CATAFLAM) 50 MG tablet Take 1 tablet (50 mg total) by mouth 3 (three) times daily. For chest pain 02/06/14  Yes Billy Fischer, MD  ibuprofen (ADVIL,MOTRIN) 600 MG tablet Take 600-1,200 mg by mouth every 8 (eight) hours as needed (pain).   Yes Historical Provider, MD   No Known Allergies  FAMILY HISTORY:  family history is not on file. SOCIAL HISTORY:  reports that he has been smoking Cigarettes.  He has been smoking about 1.00 pack per day. He does not have any smokeless tobacco history on file. He reports that he drinks about 1.2 - 1.8 oz of alcohol per week. He reports that he does not use illicit drugs.  REVIEW OF SYSTEMS:   Constitutional: Negative for fever, chills, weight loss, malaise/fatigue and diaphoresis at HS  HENT: Negative for hearing loss, ear pain, nosebleeds, congestion, sore throat, neck pain, tinnitus and ear discharge.   Eyes: Negative for blurred vision, double vision, photophobia, pain, discharge and redness.  Respiratory: Negative for cough, hemoptysis, sputum production,  shortness of breath, wheezing and stridor.   Cardiovascular:  for chest pain pleuritic in nature, palpitations, orthopnea, claudication, leg swelling and PND.  Gastrointestinal: Negative for heartburn, nausea, vomiting, abdominal pain, diarrhea, constipation, blood in stool and melena.  Genitourinary: Negative for dysuria, urgency, frequency, hematuria and flank pain.  Musculoskeletal: Negative for myalgias, back pain, joint pain and falls.  Skin: Negative for itching and rash.  Neurological: Negative for dizziness, tingling, tremors,  sensory change, speech change, focal weakness, seizures, loss of consciousness, weakness and headaches.  Endo/Heme/Allergies: Negative for environmental allergies and polydipsia. Does not bruise/bleed easily.  SUBJECTIVE:  No distress   VITAL SIGNS: Temp:  [98.3 F (36.8 C)-100.3 F (37.9 C)] 98.7 F (37.1 C) (12/21 0545) Pulse Rate:  [59-102] 60 (12/21 0545) Resp:  [18-29] 22 (12/21 0545) BP: (101-138)/(56-71) 101/58 mmHg (12/21 0545) SpO2:  [96 %-100 %] 97 % (12/21 0545) Weight:  [73 kg (160 lb 15 oz)] 73 kg (160 lb 15 oz) (12/20 1416)  PHYSICAL EXAMINATION: General:  Well developed 48 year old male, not in acute distress, but does guard right w/ splinting associated w/ deep breath  Neuro:  Alert, oriented, no focal def  HEENT:  [place], no JVD  Cardiovascular:  rrr Lungs:  Decreased on right  Abdomen:  NT, no JVD  Musculoskeletal:  Intact  Skin:  Intact   Recent Labs Lab 02/14/14 0738  NA 138  K 3.9  CL 101  CO2 25  BUN 8  CREATININE 0.85  GLUCOSE 132*    Recent Labs Lab 02/14/14 0738 02/15/14 0633  HGB 13.6 12.9*  HCT 40.5 38.6*  WBC 5.6 5.8  PLT 231 262   Dg Chest 2 View  02/14/2014   CLINICAL DATA:  Chest pain and cough for 1 week  EXAM: CHEST  2 VIEW  COMPARISON:  February 06, 2014  FINDINGS: There is patchy infiltrate in the right lower lobe with minimal right effusion. Lungs elsewhere clear. Heart size and pulmonary vascularity are normal. No adenopathy. No bone lesions.  IMPRESSION: Patchy right lower lobe infiltrate with minimal right effusion. Lungs elsewhere clear.   Electronically Signed   By: Lowella Grip M.D.   On: 02/14/2014 08:40   Ct Angio Chest Pe W/cm &/or Wo Cm  02/14/2014   CLINICAL DATA:  Right-sided chest pain and back pain  EXAM: CT ANGIOGRAPHY CHEST WITH CONTRAST  TECHNIQUE: Multidetector CT imaging of the chest was performed using the standard protocol during bolus administration of intravenous contrast. Multiplanar CT image  reconstructions and MIPs were obtained to evaluate the vascular anatomy.  CONTRAST:  91mL OMNIPAQUE IOHEXOL 350 MG/ML SOLN  COMPARISON:  Chest x-ray 01/2014, 02/14/2014  FINDINGS: There is adequate opacification of the pulmonary arteries. There is no pulmonary embolus. The main pulmonary artery, right main pulmonary artery and left main pulmonary arteries are normal in size. The heart size is normal. There is no pericardial effusion. Mild LAD atherosclerosis.  Small right pleural effusion. 2.5 x 2.8 cm area of nodular airspace disease in the superior segment of the right lower lobe with Ace similar adjacent abnormality slightly inferiorly measuring 2.5 x 2.3 cm. There is a 1.5 x 1.5 cm nodular area of airspace disease in the left lower lobe peripherally. There is bibasilar atelectasis. There is no pneumothorax. There are biapical blebs.  There is no axillary, hilar, or mediastinal adenopathy.  There is no lytic or blastic osseous lesion.  The visualized portions of the upper abdomen are unremarkable.  Review of the MIP  images confirms the above findings.  IMPRESSION: 1. No evidence of pulmonary embolus. 2. There are 2 areas of nodular airspace disease in the superior segment of the right lower lobe measuring 2.5 x 2.8 cm and 2.5 x 2.3 cm respectively. There is a 1.5 x 1.5 cm nodular area of airspace disease in the left lower lobe. The overall appearance is concerning for an infectious etiology such as multilobar pneumonia versus septic emboli versus fungal infection. Malignancy is considered much less likely given that radiographically this has appeared to have rapidly progressed compared with 02/06/2014. Pulmonary Medicine consultation recommended.   Electronically Signed   By: Kathreen Devoid   On: 02/14/2014 10:19    ASSESSMENT / PLAN:  Bilateral Pulmonary Nodules.  D/Dx: infectious vs embolic vs fungal vs malignancy. He is a active smoker. Not seen on last CXR on 12/12 plain film which would argue towards  infectious etiology  Plan/rec Cont current abx These could be sampled via CT guided BX. Will d/w Nelda Marseille if we should cont current rx, then re-image, or just go forward w/ sampling.   Erick Colace ACNP-BC [place] Pager # [phone] OR # [phone] if no answer  02/15/2014, 8:30 AM  MD attending NOTE  Patient seen and examined, 30-45 pack year history of smoking, no hemoptysis and no wt loss however.  WBC appears stable and patient is a-febrile.  This is unlikely an infection.  Concern is obviously lung cancer vs metastasis.  No family or personal history of any auto-immune dysfunction making this less likely to be auto-immune related.  The two options available at this time is to call ID and see if they feel if these are infectious in nature or not, if not then would recommend a CT guided biopsy of the right sided mass as adenocarcinoma can have a similar appearance.  If they feel this can be infectious in nature then treat what they suspect is the cause of the current infection then repeat imaging in 4-6 wks.  These masses are not approachable from bronchoscopically and little can be offered by PCCM to assist with that unless ID feels a BAL is necessary but I can not think of any infectious etiology that can give a similar appearance that can be approached only bronchoscopically.  PCCM will sign off, please call back if needed.  Patient seen and examined, agree with above note.  I dictated the care and orders written for this patient under my direction.  Rush Farmer, MD [phone]

## 2014-02-16 LAB — RESPIRATORY VIRUS PANEL
Adenovirus: NOT DETECTED
INFLUENZA A: NOT DETECTED
Influenza A H1: NOT DETECTED
Influenza A H3: NOT DETECTED
Influenza B: NOT DETECTED
Metapneumovirus: NOT DETECTED
PARAINFLUENZA 2 A: NOT DETECTED
Parainfluenza 1: NOT DETECTED
Parainfluenza 3: NOT DETECTED
Respiratory Syncytial Virus A: NOT DETECTED
Respiratory Syncytial Virus B: NOT DETECTED
Rhinovirus: NOT DETECTED

## 2014-02-16 LAB — LEGIONELLA ANTIGEN, URINE

## 2014-02-16 MED ORDER — AMOXICILLIN-POT CLAVULANATE 500-125 MG PO TABS: 1.0000 | ORAL_TABLET | Freq: Three times a day (TID) | ORAL | Status: DC

## 2014-02-16 NOTE — Progress Notes (Signed)
Subjective: Pt feels much better from admission. Does have chest pain when he coughs. Pt states cough is on/off but not increased nor decreased from his usual amt. Tmax of 100.9 overnight.   Objective: Vital signs in last 24 hours: Filed Vitals:   02/15/14 1710 02/15/14 1900 02/15/14 2231 02/16/14 0539  BP:   119/65 105/58  Pulse:   67 60  Temp: 100.9 F (38.3 C) 100.4 F (38 C) 100.3 F (37.9 C) 98.6 F (37 C)  TempSrc: Oral Oral Oral Oral  Resp:   20 15  Height:      Weight:      SpO2:   96% 96%   Weight change:   Intake/Output Summary (Last 24 hours) at 02/16/14 1024 Last data filed at 02/16/14 1017  Gross per 24 hour  Intake    780 ml  Output      0 ml  Net    780 ml   General: NAD, appears stated age, thin Lungs: decrease breath sounds on RLL, no wheezing Cardiac: no murmurs, RRR GI: active bowel sounds, non tender to palpation Ext: no pedal edema MSK: non tender to palpation of rt chest wall and rt axillary region.   Lab Results: Basic Metabolic Panel:  Recent Labs Lab 02/14/14 0738 02/15/14 0633  NA 138 140  K 3.9 4.0  CL 101 104  CO2 25 24  GLUCOSE 132* 103*  BUN 8 7  CREATININE 0.85 0.85  CALCIUM 9.2 9.1   Liver Function Tests:  Recent Labs Lab 02/15/14 0004 02/15/14 0633  AST 18 19  ALT 15 17  ALKPHOS 84 82  BILITOT <0.2* 0.3  PROT 6.9 7.0  ALBUMIN 2.7* 2.8*   CBC:  Recent Labs Lab 02/14/14 0738 02/14/14 1630 02/15/14 0633  WBC 5.6  --  5.8  NEUTROABS  --  4.0 3.9  HGB 13.6  --  12.9*  HCT 40.5  --  38.6*  MCV 96.2  --  95.8  PLT 231  --  262   Cardiac Enzymes:  Recent Labs Lab 02/14/14 1254  TROPONINI <0.30    Studies/Results: No results found. Medications: I have reviewed the patient's current medications. Scheduled Meds: . azithromycin  500 mg Intravenous Q24H  . cefTRIAXone (ROCEPHIN)  IV  1 g Intravenous Q24H  . enoxaparin (LOVENOX) injection  40 mg Subcutaneous QHS  . pneumococcal 23 valent vaccine  0.5  mL Intramuscular Tomorrow-1000   Continuous Infusions:  PRN Meds:.acetaminophen, ibuprofen Assessment/Plan: Active Problems:   Community acquired pneumonia   Lung nodules   Right lower lobe CAP- - pulmonology recommend to f/u w/ ID to ensure nodules are not infectious in etiology. They recommend CT guided lung biopsy if ID believes it is not infectious.  - Dr. Linus Salmons saw patient, recommended stopping abx and proceeding w/ lung biopsy to r/o cancer vs atypical pathogen however Dr. Annamaria Boots w/ IR believes this is infectious and recommends further tx w/ abx and repeat CT chest in 4 weeks.  - due to spikes in temp to 100.3 deg F his abx were restarted, pt is clinically improving w/ abx today. Can d/c pt home on augmentin per Dr. Linus Salmons.  - continue azithromycin and rocephin IV - blood cultures NGTD - sputum culture needs to be collected.  - legionella pending - respiratory viral panel neg - motrin 600mg  q6h prn for pain  DVT prophylaxis-- lovenox  Dispo: Discharge today or tomorrow.   The patient does not have a current PCP (No primary care  provider on file.) and does not need an [facility] hospital follow-up appointment after discharge. Pt has a hospital f/u w/ [facility] clinic on 02/24/14 on 10:15am.   The patient does not have transportation limitations that hinder transportation to clinic appointments .Services Needed at time of discharge: Y = Yes, Blank = No PT:   OT:   RN:   Equipment:   Other:     LOS: 2 days   Julious Oka, MD 02/16/2014, 10:24 AM

## 2014-02-16 NOTE — Care Management Note (Signed)
    Page 1 of 1   02/16/2014     3:29:02 PM CARE MANAGEMENT NOTE 02/16/2014  Patient:  Christian Marks,Christian Marks   Account Number:  [id number]  Date Initiated:  02/16/2014  Documentation initiated by:  Tomi Bamberger  Subjective/Objective Assessment:   dx  cap  admit- from home     Action/Plan:   Anticipated DC Date:  02/16/2014   Anticipated DC Plan:  [place]  CM consult      Choice offered to / List presented to:             Status of service:  Completed, signed off Medicare Important Message given?   (If response is "NO", the following Medicare IM given date fields will be blank) Date Medicare IM given:   Medicare IM given by:   Date Additional Medicare IM given:   Additional Medicare IM given by:    Discharge Disposition:  HOME/SELF CARE  Per UR Regulation:  Reviewed for med. necessity/level of care/duration of stay  If discussed at [place] of Stay Meetings, dates discussed:    Comments:  02/16/14 [place], bSN [phone] patient dc , no needs anticipated.

## 2014-02-16 NOTE — Discharge Summary (Signed)
Name: Christian Marks MRN: [id number] DOB: 20-Aug-1965 48 y.o. PCP: No primary care provider on file.  Date of Admission: 02/14/2014  7:24 AM Date of Discharge: 02/16/2014 Attending Physician: Axel Filler, MD  Discharge Diagnosis: Active Problems:   Community acquired pneumonia   Lung nodules  Discharge Medications:   Medication List    TAKE these medications        amoxicillin-clavulanate 500-125 MG per tablet  Commonly known as:  AUGMENTIN  Take 1 tablet (500 mg total) by mouth 3 (three) times daily.     diclofenac 50 MG tablet  Commonly known as:  CATAFLAM  Take 1 tablet (50 mg total) by mouth 3 (three) times daily. For chest pain     ibuprofen 600 MG tablet  Commonly known as:  ADVIL,MOTRIN  Take 600-1,200 mg by mouth every 8 (eight) hours as needed (pain).        Disposition and follow-up:   Christian Marks was discharged from [facility] in stable condition.  At the hospital follow up visit please address:  1.  Please ensure compliance w/ augmentin x 1 month ( end date 03/14/14)  2.  Labs / imaging needed at time of follow-up: will need chest CT in 1 month around 03/14/14  3.  Pending labs/ test needing follow-up: Legionella antigen and blood cultures.   Follow-up Appointments: Follow-up Information    Follow up with Jessee Avers, MD On 02/24/2014.   Specialty:  Internal Medicine   Why:  10:15AM  (hospital follow-up appointment)   Contact information:   [address] [place] [address] [phone]         Consultations:  Pulmonology, Infectious Disease, Interventional radiology   Procedures Performed:  Dg Chest 2 View  02/14/2014   CLINICAL DATA:  Chest pain and cough for 1 week  EXAM: CHEST  2 VIEW  COMPARISON:  February 06, 2014  FINDINGS: There is patchy infiltrate in the right lower lobe with minimal right effusion. Lungs elsewhere clear. Heart size and pulmonary vascularity are normal. No adenopathy. No  bone lesions.  IMPRESSION: Patchy right lower lobe infiltrate with minimal right effusion. Lungs elsewhere clear.   Electronically Signed   By: Lowella Grip M.D.   On: 02/14/2014 08:40   Dg Chest 2 View  02/06/2014   CLINICAL DATA:  Right upper back pain for 3 days, initial encounter  EXAM: CHEST  2 VIEW  COMPARISON:  None.  FINDINGS: The heart size and mediastinal contours are within normal limits. Both lungs are clear. The visualized skeletal structures are unremarkable.  IMPRESSION: No active cardiopulmonary disease.   Electronically Signed   By: Inez Catalina M.D.   On: 02/06/2014 17:48   Ct Angio Chest Pe W/cm &/or Wo Cm  02/14/2014   CLINICAL DATA:  Right-sided chest pain and back pain  EXAM: CT ANGIOGRAPHY CHEST WITH CONTRAST  TECHNIQUE: Multidetector CT imaging of the chest was performed using the standard protocol during bolus administration of intravenous contrast. Multiplanar CT image reconstructions and MIPs were obtained to evaluate the vascular anatomy.  CONTRAST:  21mL OMNIPAQUE IOHEXOL 350 MG/ML SOLN  COMPARISON:  Chest x-ray 01/2014, 02/14/2014  FINDINGS: There is adequate opacification of the pulmonary arteries. There is no pulmonary embolus. The main pulmonary artery, right main pulmonary artery and left main pulmonary arteries are normal in size. The heart size is normal. There is no pericardial effusion. Mild LAD atherosclerosis.  Small right pleural effusion. 2.5 x 2.8 cm area of nodular  airspace disease in the superior segment of the right lower lobe with Ace similar adjacent abnormality slightly inferiorly measuring 2.5 x 2.3 cm. There is a 1.5 x 1.5 cm nodular area of airspace disease in the left lower lobe peripherally. There is bibasilar atelectasis. There is no pneumothorax. There are biapical blebs.  There is no axillary, hilar, or mediastinal adenopathy.  There is no lytic or blastic osseous lesion.  The visualized portions of the upper abdomen are unremarkable.  Review of  the MIP images confirms the above findings.  IMPRESSION: 1. No evidence of pulmonary embolus. 2. There are 2 areas of nodular airspace disease in the superior segment of the right lower lobe measuring 2.5 x 2.8 cm and 2.5 x 2.3 cm respectively. There is a 1.5 x 1.5 cm nodular area of airspace disease in the left lower lobe. The overall appearance is concerning for an infectious etiology such as multilobar pneumonia versus septic emboli versus fungal infection. Malignancy is considered much less likely given that radiographically this has appeared to have rapidly progressed compared with 02/06/2014. Pulmonary Medicine consultation recommended.   Electronically Signed   By: Kathreen Devoid   On: 02/14/2014 10:19     Admission HPI: Pt is a 48 y/o male w/ no PMHx (has not seen a PCP in 10 years) who presents the ED with rt sided chest pain. Chest pain begins at his rt shoulder and radiates to his rt chest wall and axial region. Pain is sharp and constant. Pain is exacerbating by movement and does not improve w/ laying still. He was seen at urgent care 1 week ago for rt shoulder pain only. He was given diclofenac for pain and d/c'd home. Pt states diclofenac helped resolve rt shoulder pain but then 2 days later he noticed that after pain meds wore off he was still having significant amt of pain, as well as pain was now located over chest wall. He endorses chills and NS. Denies sick contacts, recent travel, n/v, rashes, fevers, significant weight loss, and IVDU. He states he has some coughing with sputum production yesterday. He smokes 1PPD for the past 30 years, drinks around 3 beers per day- last drink was 1 beer yesterday, and denies family hx of cancer. 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 Right lower lobe CAP-- CTA of chest reveals nodules in RLL and LLL concerning for infectious  etiology such as PNA, septic emboli, or fungal infxn. D. Dimer 1.56, pro calcitonin 0.5. No leukocytosis. Started on azithromycin and rocephin IV for 3 days with clinical improvement in symptoms. He was then transitioned to augmentin x 1 month until CT scan of chest. Blood cultures NGTD. Strep pneumo urinary antigen and respiratory viral panel negative.  Pulmonology saw patient, suspicious that lung nodules on CT could be cancer vs mets. Recommended CT guided lung biopsy if ID felt nodules were not infectious. ID saw patient and felt that nodules were not infectious and recommended proceeding w/ biopsy. However, radiology felt nodules to be infectious in nature considering recent spikes in temperature up to 100.9 deg F and the fact nodules were bilateral. Recommended continuing abx and f/u chest CT in 4 weeks.   Discharge Vitals:   BP 105/58 mmHg  Pulse 60  Temp(Src) 98.6 F (37 C) (Oral)  Resp 15  Ht 6' (1.829 m)  Wt 160 lb 15  oz (73 kg)  BMI 21.82 kg/m2  SpO2 96%  Discharge Labs:  Results for Christian, Marks (MRN [id number]) as of 02/16/2014 10:29  Ref. Range 02/15/2014 06:33  Sodium Latest Range: 137-147 mEq/L 140  Potassium Latest Range: 3.7-5.3 mEq/L 4.0  Chloride Latest Range: 96-112 mEq/L 104  CO2 Latest Range: 19-32 mEq/L 24  BUN Latest Range: 6-23 mg/dL 7  Creatinine Latest Range: 0.50-1.35 mg/dL 0.85  Calcium Latest Range: 8.4-10.5 mg/dL 9.1  GFR calc non Af Amer Latest Range: >90 mL/min >90  GFR calc Af Amer Latest Range: >90 mL/min >90  Glucose Latest Range: 70-99 mg/dL 103 (H)  Anion gap Latest Range: 5-15  12  Alkaline Phosphatase Latest Range: 39-117 U/L 82  Albumin Latest Range: 3.5-5.2 g/dL 2.8 (L)  AST Latest Range: 0-37 U/L 19  ALT Latest Range: 0-53 U/L 17  Total Protein Latest Range: 6.0-8.3 g/dL 7.0  Total Bilirubin Latest Range: 0.3-1.2 mg/dL 0.3  WBC Latest Range: 4.0-10.5 K/uL 5.8  RBC Latest Range: 4.22-5.81 MIL/uL 4.03 (L)  Hemoglobin Latest Range:  13.0-17.0 g/dL 12.9 (L)  HCT Latest Range: 39.0-52.0 % 38.6 (L)  MCV Latest Range: 78.0-100.0 fL 95.8  MCH Latest Range: 26.0-34.0 pg 32.0  MCHC Latest Range: 30.0-36.0 g/dL 33.4  RDW Latest Range: 11.5-15.5 % 12.8  Platelets Latest Range: 150-400 K/uL 262  Neutrophils Relative % Latest Range: 43-77 % 67  Lymphocytes Relative Latest Range: 12-46 % 18  Monocytes Relative Latest Range: 3-12 % 12  Eosinophils Relative Latest Range: 0-5 % 3  Basophils Relative Latest Range: 0-1 % 0  NEUT# Latest Range: 1.7-7.7 K/uL 3.9  Lymphocytes Absolute Latest Range: 0.7-4.0 K/uL 1.0  Monocytes Absolute Latest Range: 0.1-1.0 K/uL 0.7  Eosinophils Absolute Latest Range: 0.0-0.7 K/uL 0.2  Basophils Absolute Latest Range: 0.0-0.1 K/uL 0.0    Signed: Julious Oka, MD 02/16/2014, 12:42 PM    Services Ordered on Discharge: none Equipment Ordered on Discharge: none

## 2014-02-16 NOTE — Discharge Instructions (Signed)
Start taking augmenting three times a day for a total of 1 month. Your last dose of antibiotics should be 03/14/14. You will need a CT scan of your chest in 1 month.   If your symptoms worsen or you develop a fever go to the ED.    Pneumonia, Adult Pneumonia is an infection of the lungs. It may be caused by a germ (virus or bacteria). Some types of pneumonia can spread easily from person to person. This can happen when you cough or sneeze. HOME CARE  Only take medicine as told by your doctor.  Take your medicine (antibiotics) as told. Finish it even if you start to feel better.  Do not smoke.  You may use a vaporizer or humidifier in your room. This can help loosen thick spit (mucus).  Sleep so you are almost sitting up (semi-upright). This helps reduce coughing.  Rest. A shot (vaccine) can help prevent pneumonia. Shots are often advised for:  People over 75 years old.  Patients on chemotherapy.  People with long-term (chronic) lung problems.  People with immune system problems. GET HELP RIGHT AWAY IF:   You are getting worse.  You cannot control your cough, and you are losing sleep.  You cough up blood.  Your pain gets worse, even with medicine.  You have a fever.  Any of your problems are getting worse, not better.  You have shortness of breath or chest pain. MAKE SURE YOU:   Understand these instructions.  Will watch your condition.  Will get help right away if you are not doing well or get worse. Document Released: 08/01/2007 Document Revised: 05/07/2011 Document Reviewed: 05/05/2010 [facility] Patient Information 2015 [place], [place]. This information is not intended to replace advice given to you by your health care provider. Make sure you discuss any questions you have with your health care provider.

## 2014-02-16 NOTE — Progress Notes (Signed)
[facility] for Infectious Disease  Date of Admission:  02/14/2014  Antibiotics: Ceftriaxone and azithromycin  Subjective: Still with pleuritic chest pain  Objective: Temp:  [98.6 F (37 C)-100.9 F (38.3 C)] 98.6 F (37 C) (12/22 0539) Pulse Rate:  [60-72] 60 (12/22 0539) Resp:  [15-20] 15 (12/22 0539) BP: (105-119)/(46-65) 105/58 mmHg (12/22 0539) SpO2:  [96 %-98 %] 96 % (12/22 0539)  General: Awake, alert, mild distress with pain Skin: no rashes Lungs: CTA B Cor: RRR Abdomen: soft, nt, nd Ext: no edema  Lab Results Lab Results  Component Value Date   WBC 5.8 02/15/2014   HGB 12.9* 02/15/2014   HCT 38.6* 02/15/2014   MCV 95.8 02/15/2014   PLT 262 02/15/2014    Lab Results  Component Value Date   CREATININE 0.85 02/15/2014   BUN 7 02/15/2014   NA 140 02/15/2014   K 4.0 02/15/2014   CL 104 02/15/2014   CO2 24 02/15/2014    Lab Results  Component Value Date   ALT 17 02/15/2014   AST 19 02/15/2014   ALKPHOS 82 02/15/2014   BILITOT 0.3 02/15/2014      Microbiology: Recent Results (from the past 240 hour(s))  Culture, blood (routine x 2)     Status: None (Preliminary result)   Collection Time: 02/14/14 11:15 AM  Result Value Ref Range Status   Specimen Description BLOOD LEFT ARM  Final   Special Requests BOTTLES DRAWN AEROBIC AND ANAEROBIC 10 CC  Final   Culture  Setup Time   Final    02/14/2014 18:31 Performed at [facility]    Culture   Final           BLOOD CULTURE RECEIVED NO GROWTH TO DATE CULTURE WILL BE HELD FOR 5 DAYS BEFORE ISSUING A FINAL NEGATIVE REPORT Performed at [facility]    Report Status PENDING  Incomplete  Culture, blood (routine x 2)     Status: None (Preliminary result)   Collection Time: 02/14/14 11:25 AM  Result Value Ref Range Status   Specimen Description BLOOD RIGHT ARM  Final   Special Requests BOTTLES DRAWN AEROBIC AND ANAEROBIC 10 CC  Final   Culture  Setup Time   Final    02/14/2014  18:30 Performed at [facility]    Culture   Final           BLOOD CULTURE RECEIVED NO GROWTH TO DATE CULTURE WILL BE HELD FOR 5 DAYS BEFORE ISSUING A FINAL NEGATIVE REPORT Performed at [facility]    Report Status PENDING  Incomplete  Respiratory virus antigens panel     Status: None   Collection Time: 02/14/14  8:42 PM  Result Value Ref Range Status   Source - RVPAN NASAL SWAB  Corrected    Comment: CORRECTED ON 12/22 AT 0117: PREVIOUSLY REPORTED AS NASAL SWAB   Respiratory Syncytial Virus A NOT DETECTED  Final   Respiratory Syncytial Virus B NOT DETECTED  Final   Influenza A NOT DETECTED  Final   Influenza B NOT DETECTED  Final   Parainfluenza 1 NOT DETECTED  Final   Parainfluenza 2 NOT DETECTED  Final   Parainfluenza 3 NOT DETECTED  Final   Metapneumovirus NOT DETECTED  Final   Rhinovirus NOT DETECTED  Final   Adenovirus NOT DETECTED  Final   Influenza A H1 NOT DETECTED  Final   Influenza A H3 NOT DETECTED  Final    Comment: (NOTE)  Normal Reference Range for each Analyte: NOT DETECTED Testing performed using the Luminex xTAG Respiratory Viral Panel test kit. The analytical performance characteristics of this assay have been determined by [facility].  The modifications have not been cleared or approved by the FDA. This assay has been validated pursuant to the CLIA regulations and is used for clinical purposes. Performed at [facility]     Studies/Results: No results found.  Assessment/Plan:  1) pulmonary nodule - suspicion is non-infectious though recent CXR did not show it.  Radiology wants to defer biopsy pending empiric antibiotics. Treat with augmentin for 4 weeks Repeat CT scan in 3-4 weeks (should show resolution by then if bacterial) If resolved, stop antibiotics If persistent nodules, consider CT guided biopsy for cancer vs AFB or fungal Ok from [place] for discharge   Scharlene Gloss, [place] for  Infectious Disease [place] [email or web address] [phone] pager   [phone] cell 02/16/2014, 12:39 PM

## 2014-02-20 LAB — CULTURE, BLOOD (ROUTINE X 2)
CULTURE: NO GROWTH
CULTURE: NO GROWTH

## 2014-02-22 LAB — CULTURE, BLOOD (ROUTINE X 2)
CULTURE: NO GROWTH
CULTURE: NO GROWTH

## 2014-02-24 ENCOUNTER — Encounter: Payer: Self-pay | Admitting: Internal Medicine

## 2014-02-24 ENCOUNTER — Ambulatory Visit (INDEPENDENT_AMBULATORY_CARE_PROVIDER_SITE_OTHER): Payer: Managed Care, Other (non HMO) | Admitting: Internal Medicine

## 2014-02-24 VITALS — BP 113/70 | HR 66 | Temp 98.2°F | Ht 72.0 in | Wt 156.4 lb

## 2014-02-24 DIAGNOSIS — J189 Pneumonia, unspecified organism: Secondary | ICD-10-CM

## 2014-02-24 DIAGNOSIS — R918 Other nonspecific abnormal finding of lung field: Secondary | ICD-10-CM

## 2014-02-24 NOTE — Assessment & Plan Note (Signed)
Symptoms have resolved. Plan -Continue with Augmentin. End date 03/14/2014. -He will follow-up at our clinic at [facility] sometime between 20th and 25th January 2016. I encouraged him to make sure that has done the chest CT scan before his appointment date.

## 2014-02-24 NOTE — Assessment & Plan Note (Signed)
Will order a repeat chest ct without contrast to be performed on 03/17/2014. Patient verbalized understanding and promised to get this chest CT performed.

## 2014-02-24 NOTE — Patient Instructions (Signed)
General Instructions: Please take your antibiotics until complete 91/17/2016) Be sure to have ct scan of chest around 03/17/2014) Please follow up here in one month with ct scan results.  Happy new year!  Please bring your medicines with you each time you come to clinic.  Medicines may include prescription medications, over-the-counter medications, herbal remedies, eye drops, vitamins, or other pills.   Progress Toward Treatment Goals:  No flowsheet data found.  Self Care Goals & Plans:  No flowsheet data found.  No flowsheet data found.   Care Management & Community Referrals:  No flowsheet data found.

## 2014-02-24 NOTE — Progress Notes (Signed)
Patient ID: Kamori Barbier, male   DOB: [birth year], 48 y.o.   MRN: [id number]   Subjective:   HPI: Mr.Blayde Dunker is a 48 y.o. gentleman was past medical history is only cigarette smoking presents for hospital follow-up visit.  Reason(s) for this visit: Hospital follow-up of visit for pneumonia: He was discharged from the hospital on 02/14/2014, after 2 days of hospitalization with a community-acquired pneumonia. Chest imaging including a CT scan revealed 2 areas of nodular airspace disease in the superior segment of the right lower lobe measuring 2.5 x 2.8 cm and another area of 1.5 x 1.5 cm nodular airspace disease in the left lower lobe. The appearance was concerning for an infectious process versus remote possibility of malignancy. Patient therefore was evaluated in consultation with pulmonary medicine, infectious disease and interventional radiology. In conclusion, no biopsies were pursued, but instead he was advised to take antibiotics with Augmentin for one month and obtain a repeat chest imaging. He reports that since returning home, he has been feeling well without shortness of breath, fevers, cough or chest pain. He is compliant with his Augmentin.Patient does not have a primary care physician and had been out of medical care for 10 years. He also states that he has cut back on his cigarette smoking from 1 pack per day to 3 cigarettes per day. He is feeling well today.   ROS: Constitutional: Denies fever, chills, diaphoresis, appetite change and fatigue.  Respiratory: Denies SOB, DOE, cough, chest tightness, and wheezing. Denies chest pain. CVS: No chest pain, palpitations and leg swelling.  GI: No abdominal pain, nausea, vomiting, bloody stools GU: No dysuria, frequency, hematuria, or flank pain.  MSK: No myalgias, back pain, joint swelling, arthralgias  Psych: No depression symptoms. No SI or SA.    Objective:  Physical Exam: Filed Vitals:   02/24/14 1111  BP: 113/70    Pulse: 66  Temp: 98.2 F (36.8 C)  TempSrc: Oral  Height: 6' (1.829 m)  Weight: 156 lb 6.4 oz (70.943 kg)  SpO2: 100%   General: Well nourished. No acute distress.  HEENT: Normal oral mucosa. MMM.  Lungs: CTA bilaterally. Heart: RRR; no extra sounds or murmurs  Abdomen: Non-distended, normal bowel sounds, soft, nontender; no hepatosplenomegaly  Extremities: No pedal edema. No joint swelling or tenderness. Neurologic: Normal EOM,  Alert and oriented x3. No obvious neurologic/cranial nerve deficits.  Assessment & Plan:  Discussed case with my attending in the clinic, Dr. Ellwood Dense. See problem based charting.

## 2014-02-24 NOTE — Progress Notes (Signed)
Internal Medicine Clinic Attending  Case discussed with Dr. Kazibwe at the time of the visit.  We reviewed the resident's history and exam and pertinent patient test results.  I agree with the assessment, diagnosis, and plan of care documented in the resident's note. 

## 2014-06-10 ENCOUNTER — Ambulatory Visit (INDEPENDENT_AMBULATORY_CARE_PROVIDER_SITE_OTHER): Payer: Managed Care, Other (non HMO) | Admitting: Family Medicine

## 2014-06-10 ENCOUNTER — Ambulatory Visit (INDEPENDENT_AMBULATORY_CARE_PROVIDER_SITE_OTHER): Payer: Managed Care, Other (non HMO)

## 2014-06-10 VITALS — BP 124/74 | HR 62 | Temp 97.8°F | Resp 16 | Ht 72.0 in | Wt 160.0 lb

## 2014-06-10 DIAGNOSIS — M79642 Pain in left hand: Secondary | ICD-10-CM

## 2014-06-10 DIAGNOSIS — M7989 Other specified soft tissue disorders: Secondary | ICD-10-CM | POA: Diagnosis not present

## 2014-06-10 LAB — POCT CBC
GRANULOCYTE PERCENT: 49.1 % (ref 37–80)
HEMATOCRIT: 46.8 % (ref 43.5–53.7)
HEMOGLOBIN: 14.8 g/dL (ref 14.1–18.1)
Lymph, poc: 1.9 (ref 0.6–3.4)
MCH, POC: 31.9 pg — AB (ref 27–31.2)
MCHC: 31.7 g/dL — AB (ref 31.8–35.4)
MCV: 100.6 fL — AB (ref 80–97)
MID (cbc): 0.2 (ref 0–0.9)
MPV: 7.1 fL (ref 0–99.8)
POC GRANULOCYTE: 2.1 (ref 2–6.9)
POC LYMPH PERCENT: 45.7 %L (ref 10–50)
POC MID %: 5.2 %M (ref 0–12)
Platelet Count, POC: 199 10*3/uL (ref 142–424)
RBC: 4.65 M/uL — AB (ref 4.69–6.13)
RDW, POC: 17 %
WBC: 4.2 10*3/uL — AB (ref 4.6–10.2)

## 2014-06-10 LAB — POCT SEDIMENTATION RATE: POCT SED RATE: 4 mm/hr (ref 0–22)

## 2014-06-10 LAB — URIC ACID: URIC ACID, SERUM: 3.8 mg/dL — AB (ref 4.0–7.8)

## 2014-06-10 MED ORDER — PREDNISONE 20 MG PO TABS: ORAL_TABLET | ORAL | Status: DC

## 2014-06-10 NOTE — Progress Notes (Signed)
Subjective: 49 year old generally healthy man who has been having problems with swelling and pain in his left hand since Monday. He doesn't know if he had any injury though he does not recall jamming it or anything. He works in Psychologist, educational job. The hand is gotten swollen and it is painful when he tries to move the fingers.  Objective: No major distress. Visibly swollen left hand he is tender at the third MTP joint area. He is swollen proximal to that. It hurts try and move his fingers but he can move fingers. Neurovascular intact.  Assessment: Painful swollen left hand  Plan: X-ray Uric acid CBC Sedimentation rate  \[facility] reading (PRIMARY) by  Dr. Linna Darner Normal  Suspicious for gout of hand  Will treat with prednisone, which would also help if he had a sting of some sort.Marland Kitchen  He is to come back if it gets red or more swollen and he thinks it might be an infection.  Moderate complexity exam due to need for interpreting x-ray

## 2014-06-10 NOTE — Patient Instructions (Signed)
Take the prednisone 3 daily for 2 days, then 2 daily for 2 days, then 1 daily for 2 days, then one half daily for 4 days. That should help if this is gout or if it is a sting. Take in the mornings.  If it is not doing better in the next few days he should return for a recheck  Use over-the-counter Aleve 2 pills twice daily in addition to the above.

## 2014-06-16 ENCOUNTER — Encounter: Payer: Self-pay | Admitting: Family Medicine

## 2014-06-17 ENCOUNTER — Telehealth: Payer: Self-pay

## 2014-06-17 NOTE — Telephone Encounter (Signed)
Patient is calling because he keeps trying to get his lab results and states that no one has been available to answer. Patient would like to come in to pick up labs today. Please call!

## 2017-03-05 ENCOUNTER — Telehealth: Payer: Self-pay | Admitting: Physician Assistant

## 2017-03-05 NOTE — Telephone Encounter (Signed)
Tried to call pt to remind them of their appt tomorrow 03/06/17  May need to double check phone numbers

## 2017-03-06 ENCOUNTER — Encounter: Payer: Self-pay | Admitting: Physician Assistant

## 2017-03-06 ENCOUNTER — Ambulatory Visit (INDEPENDENT_AMBULATORY_CARE_PROVIDER_SITE_OTHER): Payer: Managed Care, Other (non HMO) | Admitting: Physician Assistant

## 2017-03-06 ENCOUNTER — Other Ambulatory Visit: Payer: Self-pay

## 2017-03-06 VITALS — BP 112/84 | HR 65 | Temp 98.1°F | Ht 72.05 in | Wt 158.4 lb

## 2017-03-06 DIAGNOSIS — Z1329 Encounter for screening for other suspected endocrine disorder: Secondary | ICD-10-CM | POA: Diagnosis not present

## 2017-03-06 DIAGNOSIS — Z130 Encounter for screening for diseases of the blood and blood-forming organs and certain disorders involving the immune mechanism: Secondary | ICD-10-CM

## 2017-03-06 DIAGNOSIS — Z1389 Encounter for screening for other disorder: Secondary | ICD-10-CM

## 2017-03-06 DIAGNOSIS — Z125 Encounter for screening for malignant neoplasm of prostate: Secondary | ICD-10-CM

## 2017-03-06 DIAGNOSIS — Z0000 Encounter for general adult medical examination without abnormal findings: Secondary | ICD-10-CM

## 2017-03-06 DIAGNOSIS — Z113 Encounter for screening for infections with a predominantly sexual mode of transmission: Secondary | ICD-10-CM | POA: Diagnosis not present

## 2017-03-06 DIAGNOSIS — Z1211 Encounter for screening for malignant neoplasm of colon: Secondary | ICD-10-CM

## 2017-03-06 DIAGNOSIS — Z13220 Encounter for screening for lipoid disorders: Secondary | ICD-10-CM | POA: Diagnosis not present

## 2017-03-06 DIAGNOSIS — Z131 Encounter for screening for diabetes mellitus: Secondary | ICD-10-CM

## 2017-03-06 LAB — POCT URINALYSIS DIP (MANUAL ENTRY)
BILIRUBIN UA: NEGATIVE
Blood, UA: NEGATIVE
GLUCOSE UA: NEGATIVE mg/dL
Ketones, POC UA: NEGATIVE mg/dL
NITRITE UA: NEGATIVE
PH UA: 5.5 (ref 5.0–8.0)
Protein Ur, POC: NEGATIVE mg/dL
Spec Grav, UA: 1.02 (ref 1.010–1.025)
Urobilinogen, UA: 0.2 E.U./dL

## 2017-03-06 NOTE — Patient Instructions (Addendum)
I will follow up with you with those lab results.   Please await contact for the colonoscopy. Keeping you healthy  Get these tests  Blood pressure- Have your blood pressure checked once a year by your healthcare provider.  Normal blood pressure is 120/80  Weight- Have your body mass index (BMI) calculated to screen for obesity.  BMI is a measure of body fat based on height and weight. You can also calculate your own BMI at ViewBanking.[email or web address].  Cholesterol- Have your cholesterol checked every year.  Diabetes- Have your blood sugar checked regularly if you have high blood pressure, high cholesterol, have a family history of diabetes or if you are overweight.  Screening for Colon Cancer- Colonoscopy starting at age 52.  Screening may begin sooner depending on your family history and other health conditions. Follow up colonoscopy as directed by your Gastroenterologist.  Screening for Prostate Cancer- Both blood work (PSA) and a rectal exam help screen for Prostate Cancer.  Screening begins at age 52 with African-American men and at age 26 with Caucasian men.  Screening may begin sooner depending on your family history.  Take these medicines  Aspirin- One aspirin daily can help prevent Heart disease and Stroke.  Flu shot- Every fall.  Tetanus- Every 10 years.  Zostavax- Once after the age of 8 to prevent Shingles.  Pneumonia shot- Once after the age of 84; if you are younger than 59, ask your healthcare provider if you need a Pneumonia shot.  Take these steps  Don't smoke- If you do smoke, talk to your doctor about quitting.  For tips on how to quit, go to www.smokefree.gov or call 1-800-QUIT-NOW.  Be physically active- Exercise 5 days a week for at least 30 minutes.  If you are not already physically active start slow and gradually work up to 30 minutes of moderate physical activity.  Examples of moderate activity include walking briskly, mowing the yard, dancing, swimming,  bicycling, etc.  Eat a healthy diet- Eat a variety of healthy food such as fruits, vegetables, low fat milk, low fat cheese, yogurt, lean meant, poultry, fish, beans, tofu, etc. For more information go to www.thenutritionsource.org  Drink alcohol in moderation- Limit alcohol intake to less than two drinks a day. Never drink and drive.  Dentist- Brush and floss twice daily; visit your dentist twice a year.  Depression- Your emotional health is as important as your physical health. If you're feeling down, or losing interest in things you would normally enjoy please talk to your healthcare provider.  Eye exam- Visit your eye doctor every year.  Safe sex- If you may be exposed to a sexually transmitted infection, use a condom.  Seat belts- Seat belts can save your life; always wear one.  Smoke/Carbon Monoxide detectors- These detectors need to be installed on the appropriate level of your home.  Replace batteries at least once a year.  Skin cancer- When out in the sun, cover up and use sunscreen 15 SPF or higher.  Violence- If anyone is threatening you, please tell your healthcare provider.  Living Will/ Health care power of attorney- Speak with your healthcare provider and family.   IF you received an x-ray today, you will receive an invoice from [facility]. Please contact [facility] at [phone] with questions or concerns regarding your invoice.   IF you received labwork today, you will receive an invoice from [place]. Please contact [facility] at [phone] with questions or concerns regarding your invoice.   Our billing staff will  not be able to assist you with questions regarding bills from these companies.  You will be contacted with the lab results as soon as they are available. The fastest way to get your results is to activate your My Chart account. Instructions are located on the last page of this paperwork. If you have not heard from [place] regarding the  results in 2 weeks, please contact this office.

## 2017-03-06 NOTE — Progress Notes (Signed)
PRIMARY CARE AT [facility] [address], [place] [address] [phone]  Date:  03/06/2017   Name:  Christian Marks   DOB:  10-Nov-1965   MRN:  [id number]  PCP:  Patient, No Pcp Per    History of Present Illness:  Christian Marks is a 52 y.o. male patient who presents to PCP with  Chief Complaint  Patient presents with  . Annual Exam     DIET: he eats everything.  He does not get vegetables in every day.  He is not eating fastfood.  He is cooking.  He cooks a lot of meat;red meat, chicken, fish.  Water intake: 32oz daily.  Soda: 2L per day.    BM: normal.  He does not look at his stool.  Some diarrhea  URINATION: he does not have a steady stream.  Weakened.  No hx of prostate issues.  No blood in the urine.  He has urinary frequency.  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   Son--67 years old.  Daughter 64 years old.  etOH use: 2 beers per day.  Tobacco or vaping.  1ppd Illicit drug use: cannabis use  Sexually active: not recently.  Issues with maintaining and decreased libido.  Couple of years ago  Colonoscopy: none   Patient Active Problem List   Diagnosis Date Noted  . Lung nodules   . Community acquired pneumonia 02/14/2014    History reviewed. No pertinent past medical history.  History reviewed. No pertinent surgical history.  Social History   Tobacco Use  . Smoking status: Current Every Day Smoker    Packs/day: 1.00    Types: Cigarettes  . Smokeless tobacco: Never Used  Substance Use Topics  . Alcohol use: Yes    Alcohol/week: 1.2 - 1.8 oz    Types: 2 - 3 Cans of beer per week    Comment: daily  . Drug use: No    Family History  Problem Relation Age of Onset  . Diabetes Mother   . Diabetes Sister     No Known Allergies  Medication list has been reviewed and updated.  Current Outpatient Medications on File Prior to Visit  Medication Sig  Dispense Refill  . ibuprofen (ADVIL,MOTRIN) 200 MG tablet Take 200 mg by mouth every 6 (six) hours as needed.    . predniSONE (DELTASONE) 20 MG tablet Take 3 daily for 2 days, then 2 daily for 2 days, then 1 daily for 2 days, then one half daily 12 tablet 14   No current facility-administered medications on file prior to visit.     ROS ROS otherwise unremarkable unless listed above.  Physical Examination: BP 112/84 (BP Location: Left Arm, Patient Position: Sitting, Cuff Size: Normal)   Pulse 65   Temp 98.1 F (36.7 C) (Oral)   Ht 6' 0.05" (1.83 m)   Wt 158 lb 6.4 oz (71.8 kg)   SpO2 98%   BMI 21.45 kg/m  Ideal Body Weight: Weight in (lb) to have BMI = 25: 184.2  Physical Exam  Constitutional: He is oriented to person, place, and time. He appears well-developed and well-nourished. No distress.  HENT:  Head: Normocephalic and atraumatic.  Right Ear: Tympanic membrane, external ear and ear canal normal.  Left Ear: Tympanic membrane, external ear and ear canal normal.  Eyes: Conjunctivae and EOM are normal. Pupils are equal, round, and reactive to light.  Cardiovascular: Normal rate and  regular rhythm. Exam reveals no friction rub.  No murmur heard. Pulmonary/Chest: Effort normal. No respiratory distress. He has no wheezes.  Abdominal: Soft. Bowel sounds are normal. He exhibits no distension and no mass. There is no tenderness.  Musculoskeletal: Normal range of motion. He exhibits no edema or tenderness.  Neurological: He is alert and oriented to person, place, and time. He displays normal reflexes.  Skin: Skin is warm and dry. He is not diaphoretic.  Psychiatric: He has a normal mood and affect. His behavior is normal.   Results for orders placed or performed in visit on 03/06/17  Lipid panel  Result Value Ref Range   Cholesterol, Total 197 100 - 199 mg/dL   Triglycerides 86 0 - 149 mg/dL   HDL 77 >39 mg/dL   VLDL Cholesterol Cal 17 5 - 40 mg/dL   LDL Calculated 103 (H) 0 -  99 mg/dL   Chol/HDL Ratio 2.6 0.0 - 5.0 ratio  TSH  Result Value Ref Range   TSH 0.642 0.450 - 4.500 uIU/mL  CBC with Differential/Platelet  Result Value Ref Range   WBC 3.1 (L) 3.4 - 10.8 x10E3/uL   RBC 4.78 4.14 - 5.80 x10E6/uL   Hemoglobin 15.7 13.0 - 17.7 g/dL   Hematocrit 45.8 37.5 - 51.0 %   MCV 96 79 - 97 fL   MCH 32.8 26.6 - 33.0 pg   MCHC 34.3 31.5 - 35.7 g/dL   RDW 13.8 12.3 - 15.4 %   Platelets 226 150 - 379 x10E3/uL   Neutrophils 34 Not Estab. %   Lymphs 59 Not Estab. %   Monocytes 6 Not Estab. %   Eos 1 Not Estab. %   Basos 0 Not Estab. %   Neutrophils Absolute 1.1 (L) 1.4 - 7.0 x10E3/uL   Lymphocytes Absolute 1.9 0.7 - 3.1 x10E3/uL   Monocytes Absolute 0.2 0.1 - 0.9 x10E3/uL   EOS (ABSOLUTE) 0.0 0.0 - 0.4 x10E3/uL   Basophils Absolute 0.0 0.0 - 0.2 x10E3/uL   Immature Granulocytes 0 Not Estab. %   Immature Grans (Abs) 0.0 0.0 - 0.1 x10E3/uL  PSA  Result Value Ref Range   Prostate Specific Ag, Serum 0.7 0.0 - 4.0 ng/mL  RPR  Result Value Ref Range   RPR Ser Ql Non Reactive Non Reactive  HIV antibody  Result Value Ref Range   HIV Screen 4th Generation wRfx Non Reactive Non Reactive  POCT urinalysis dipstick  Result Value Ref Range   Color, UA yellow yellow   Clarity, UA clear clear   Glucose, UA negative negative mg/dL   Bilirubin, UA negative negative   Ketones, POC UA negative negative mg/dL   Spec Grav, UA 1.020 1.010 - 1.025   Blood, UA negative negative   pH, UA 5.5 5.0 - 8.0   Protein Ur, POC negative negative mg/dL   Urobilinogen, UA 0.2 0.2 or 1.0 E.U./dL   Nitrite, UA Negative Negative   Leukocytes, UA Trace (A) Negative   . Assessment and Plan: Christian Marks is a 52 y.o. male who is here today for cc of  Chief Complaint  Patient presents with  . Annual Exam  --psa obtained today and urine analysis. Annual physical exam - Plan: Lipid panel, TSH, CBC with Differential/Platelet, PSA, HIV antibody, RPR, POCT urinalysis dipstick,  Ambulatory referral to Gastroenterology  Screening for prostate cancer - Plan: PSA  Screening for deficiency anemia - Plan: CBC with Differential/Platelet  Screening for diabetes mellitus  Screening for thyroid disorder - Plan: TSH  Screening for lipid disorders - Plan: Lipid panel  Screening for STD (sexually transmitted disease) - Plan: HIV antibody, RPR  Screening for hematuria or proteinuria - Plan: POCT urinalysis dipstick  Special screening for malignant neoplasms, colon - Plan: Ambulatory referral to Gastroenterology  Ivar Drape, PA-C [facility] 1/9/20196:20 PM  Ivar Drape, PA-C [facility] 1/9/20192:33 PM

## 2017-03-07 LAB — CBC WITH DIFFERENTIAL/PLATELET
BASOS ABS: 0 10*3/uL (ref 0.0–0.2)
Basos: 0 %
EOS (ABSOLUTE): 0 10*3/uL (ref 0.0–0.4)
Eos: 1 %
Hematocrit: 45.8 % (ref 37.5–51.0)
Hemoglobin: 15.7 g/dL (ref 13.0–17.7)
IMMATURE GRANULOCYTES: 0 %
Immature Grans (Abs): 0 10*3/uL (ref 0.0–0.1)
Lymphocytes Absolute: 1.9 10*3/uL (ref 0.7–3.1)
Lymphs: 59 %
MCH: 32.8 pg (ref 26.6–33.0)
MCHC: 34.3 g/dL (ref 31.5–35.7)
MCV: 96 fL (ref 79–97)
Monocytes Absolute: 0.2 10*3/uL (ref 0.1–0.9)
Monocytes: 6 %
NEUTROS PCT: 34 %
Neutrophils Absolute: 1.1 10*3/uL — ABNORMAL LOW (ref 1.4–7.0)
PLATELETS: 226 10*3/uL (ref 150–379)
RBC: 4.78 x10E6/uL (ref 4.14–5.80)
RDW: 13.8 % (ref 12.3–15.4)
WBC: 3.1 10*3/uL — ABNORMAL LOW (ref 3.4–10.8)

## 2017-03-07 LAB — HIV ANTIBODY (ROUTINE TESTING W REFLEX): HIV SCREEN 4TH GENERATION: NONREACTIVE

## 2017-03-07 LAB — LIPID PANEL
Chol/HDL Ratio: 2.6 ratio (ref 0.0–5.0)
Cholesterol, Total: 197 mg/dL (ref 100–199)
HDL: 77 mg/dL (ref >39)
LDL Calculated: 103 mg/dL — ABNORMAL HIGH (ref 0–99)
Triglycerides: 86 mg/dL (ref 0–149)
VLDL Cholesterol Cal: 17 mg/dL (ref 5–40)

## 2017-03-07 LAB — TSH: TSH: 0.642 u[IU]/mL (ref 0.450–4.500)

## 2017-03-07 LAB — PSA: Prostate Specific Ag, Serum: 0.7 ng/mL (ref 0.0–4.0)

## 2017-03-07 LAB — RPR: RPR Ser Ql: NONREACTIVE

## 2017-03-12 ENCOUNTER — Encounter: Payer: Self-pay | Admitting: Gastroenterology

## 2017-03-21 ENCOUNTER — Other Ambulatory Visit: Payer: Self-pay | Admitting: Physician Assistant

## 2017-03-21 DIAGNOSIS — D729 Disorder of white blood cells, unspecified: Secondary | ICD-10-CM

## 2017-03-21 DIAGNOSIS — Z13228 Encounter for screening for other metabolic disorders: Secondary | ICD-10-CM

## 2017-03-21 DIAGNOSIS — R799 Abnormal finding of blood chemistry, unspecified: Secondary | ICD-10-CM

## 2017-03-21 DIAGNOSIS — Z0000 Encounter for general adult medical examination without abnormal findings: Secondary | ICD-10-CM

## 2017-03-22 ENCOUNTER — Ambulatory Visit: Payer: Managed Care, Other (non HMO) | Admitting: Physician Assistant

## 2017-03-22 ENCOUNTER — Encounter: Payer: Self-pay | Admitting: Physician Assistant

## 2017-03-22 ENCOUNTER — Other Ambulatory Visit: Payer: Self-pay

## 2017-03-22 ENCOUNTER — Other Ambulatory Visit: Payer: Self-pay | Admitting: Physician Assistant

## 2017-03-22 VITALS — BP 118/68 | HR 66 | Temp 97.9°F | Resp 16 | Ht 72.0 in | Wt 158.0 lb

## 2017-03-22 DIAGNOSIS — Z13228 Encounter for screening for other metabolic disorders: Secondary | ICD-10-CM | POA: Diagnosis not present

## 2017-03-22 DIAGNOSIS — M7989 Other specified soft tissue disorders: Secondary | ICD-10-CM

## 2017-03-22 LAB — POCT CBC
GRANULOCYTE PERCENT: 48.5 % (ref 37–80)
HCT, POC: 45.4 % (ref 43.5–53.7)
HEMOGLOBIN: 14.9 g/dL (ref 14.1–18.1)
Lymph, poc: 1.3 (ref 0.6–3.4)
MCH: 32.1 pg — AB (ref 27–31.2)
MCHC: 32.8 g/dL (ref 31.8–35.4)
MCV: 98 fL — AB (ref 80–97)
MID (CBC): 0.2 (ref 0–0.9)
MPV: 6.7 fL (ref 0–99.8)
POC Granulocyte: 1.4 — AB (ref 2–6.9)
POC LYMPH PERCENT: 45.7 %L (ref 10–50)
POC MID %: 5.8 %M (ref 0–12)
Platelet Count, POC: 193 10*3/uL (ref 142–424)
RBC: 4.63 M/uL — AB (ref 4.69–6.13)
RDW, POC: 13.9 %
WBC: 2.8 10*3/uL — AB (ref 4.6–10.2)

## 2017-03-22 LAB — GLUCOSE, POCT (MANUAL RESULT ENTRY): POC Glucose: 78 mg/dl (ref 70–99)

## 2017-03-22 LAB — SEDIMENTATION RATE: SED RATE: 9 mm/h (ref 0–30)

## 2017-03-22 MED ORDER — PREDNISONE 20 MG PO TABS: ORAL_TABLET | ORAL | 0 refills | Status: DC

## 2017-03-22 NOTE — Progress Notes (Signed)
PRIMARY CARE AT [facility] [address] [place] [address] [phone]  Date:  03/22/2017   Name:  Christian Marks   DOB:  24-Sep-1965   MRN:  [id number]  PCP:  Patient, No Pcp Per    History of Present Illness:  Christian Marks is a 52 y.o. male patient who presents to PCP with  Chief Complaint  Patient presents with  . Hand Pain    pt states his rt hand is swollen and its hard to write. both hands hurt/ x 1wk     Pt has right hand swelling where it is difficult to write or grasp.  He notes that the swelling was more at the middle finger, but this has improved some.  Initially both hands were hurting at the time, but then the right hand had hurt worse.  He notes no trauma or cuts.  He was not performing anything too strenuous.   No fever.   No redness or warmth to the area.   Patient Active Problem List   Diagnosis Date Noted  . Lung nodules   . Community acquired pneumonia 02/14/2014    History reviewed. No pertinent past medical history.  History reviewed. No pertinent surgical history.  Social History   Tobacco Use  . Smoking status: Current Every Day Smoker    Packs/day: 1.00    Types: Cigarettes  . Smokeless tobacco: Never Used  Substance Use Topics  . Alcohol use: Yes    Alcohol/week: 1.2 - 1.8 oz    Types: 2 - 3 Cans of beer per week    Comment: daily  . Drug use: No    Family History  Problem Relation Age of Onset  . Diabetes Mother   . Diabetes Sister     No Known Allergies  Medication list has been reviewed and updated.  Current Outpatient Medications on File Prior to Visit  Medication Sig Dispense Refill  . ibuprofen (ADVIL,MOTRIN) 200 MG tablet Take 200 mg by mouth every 6 (six) hours as needed.    . predniSONE (DELTASONE) 20 MG tablet Take 3 daily for 2 days, then 2 daily for 2 days, then 1 daily for 2 days, then one half daily (Patient not taking: Reported on 03/22/2017) 12 tablet 14   No current facility-administered medications on  file prior to visit.     ROS ROS otherwise unremarkable unless listed above.  Physical Examination: BP 118/68   Pulse 66   Temp 97.9 F (36.6 C) (Oral)   Resp 16   Ht 6' (1.829 m)   Wt 158 lb (71.7 kg)   SpO2 99%   BMI 21.43 kg/m  Ideal Body Weight: Weight in (lb) to have BMI = 25: 183.9  Physical Exam  Constitutional: He is oriented to person, place, and time. He appears well-developed and well-nourished. No distress.  HENT:  Head: Normocephalic and atraumatic.  Eyes: Conjunctivae and EOM are normal. Pupils are equal, round, and reactive to light.  Cardiovascular: Normal rate.  Pulmonary/Chest: Effort normal. No respiratory distress.  Musculoskeletal:  Middle mcp joint with mild swelling without erythema.  Normal strength throughout.    Neurological: He is alert and oriented to person, place, and time.  Skin: Skin is warm and dry. He is not diaphoretic.  Psychiatric: He has a normal mood and affect. His behavior is normal.     Results for orders placed or performed in visit on 03/22/17  POCT CBC  Result Value Ref Range   WBC 2.8 (A) 4.6 -  10.2 K/uL   Lymph, poc 1.3 0.6 - 3.4   POC LYMPH PERCENT 45.7 10 - 50 %L   MID (cbc) 0.2 0 - 0.9   POC MID % 5.8 0 - 12 %M   POC Granulocyte 1.4 (A) 2 - 6.9   Granulocyte percent 48.5 37 - 80 %G   RBC 4.63 (A) 4.69 - 6.13 M/uL   Hemoglobin 14.9 14.1 - 18.1 g/dL   HCT, POC 45.4 43.5 - 53.7 %   MCV 98.0 (A) 80 - 97 fL   MCH, POC 32.1 (A) 27 - 31.2 pg   MCHC 32.8 31.8 - 35.4 g/dL   RDW, POC 13.9 %   Platelet Count, POC 193 142 - 424 K/uL   MPV 6.7 0 - 99.8 fL    Assessment and Plan: Christian Marks is a 52 y.o. male who is here today for cc of  Chief Complaint  Patient presents with  . Hand Pain    pt states his rt hand is swollen and its hard to write. both hands hurt/ x 1wk   Follow up in 2 weeks regarding swelling.  We will recheck cbc and sed rate at this time.  Possible gout though appears unlikely. Ordered future  labs for physical exam. Swelling of right hand - Plan: Sedimentation Rate, POCT CBC, Vitamin B12, POCT glucose (manual entry), predniSONE (DELTASONE) 20 MG tablet  Screening for metabolic disorder - Plan: CMP14+EGFR  Ivar Drape, PA-C [facility] 2/5/20193:28 PM

## 2017-03-22 NOTE — Patient Instructions (Addendum)
  Please take the prednisone as prescribed.  I will follow up with your lab results shortly.   You can stay in this brace.  Continue to ice the hand.    IF you received an x-ray today, you will receive an invoice from [facility]. Please contact [facility] at [phone] with questions or concerns regarding your invoice.   IF you received labwork today, you will receive an invoice from [facility]. Please contact [facility] at [phone] with questions or concerns regarding your invoice.   Our billing staff will not be able to assist you with questions regarding bills from these companies.  You will be contacted with the lab results as soon as they are available. The fastest way to get your results is to activate your My Chart account. Instructions are located on the last page of this paperwork. If you have not heard from [place] regarding the results in 2 weeks, please contact this office.

## 2017-03-23 LAB — CMP14+EGFR
A/G RATIO: 1.6 (ref 1.2–2.2)
ALK PHOS: 81 IU/L (ref 39–117)
ALT: 22 IU/L (ref 0–44)
AST: 23 IU/L (ref 0–40)
Albumin: 4.4 g/dL (ref 3.5–5.5)
BILIRUBIN TOTAL: 0.5 mg/dL (ref 0.0–1.2)
BUN/Creatinine Ratio: 10 (ref 9–20)
BUN: 10 mg/dL (ref 6–24)
CHLORIDE: 103 mmol/L (ref 96–106)
CO2: 22 mmol/L (ref 20–29)
Calcium: 9.7 mg/dL (ref 8.7–10.2)
Creatinine, Ser: 0.98 mg/dL (ref 0.76–1.27)
GFR calc non Af Amer: 89 mL/min/{1.73_m2} (ref >59)
GFR, EST AFRICAN AMERICAN: 103 mL/min/{1.73_m2} (ref >59)
Globulin, Total: 2.8 g/dL (ref 1.5–4.5)
Glucose: 79 mg/dL (ref 65–99)
POTASSIUM: 4.2 mmol/L (ref 3.5–5.2)
Sodium: 139 mmol/L (ref 134–144)
TOTAL PROTEIN: 7.2 g/dL (ref 6.0–8.5)

## 2017-03-23 LAB — VITAMIN B12: Vitamin B-12: 362 pg/mL (ref 232–1245)

## 2017-03-25 NOTE — Progress Notes (Signed)
Letter sent.

## 2017-04-15 ENCOUNTER — Ambulatory Visit (AMBULATORY_SURGERY_CENTER): Payer: Self-pay | Admitting: *Deleted

## 2017-04-15 ENCOUNTER — Other Ambulatory Visit: Payer: Self-pay

## 2017-04-15 VITALS — Ht 72.0 in | Wt 158.0 lb

## 2017-04-15 DIAGNOSIS — Z1211 Encounter for screening for malignant neoplasm of colon: Secondary | ICD-10-CM

## 2017-04-15 MED ORDER — NA SULFATE-K SULFATE-MG SULF 17.5-3.13-1.6 GM/177ML PO SOLN: ORAL | 0 refills | Status: DC

## 2017-04-15 NOTE — Progress Notes (Signed)
Patient denies any allergies to eggs or soy. Patient denies any problems with anesthesia/sedation. Patient denies any oxygen use at home. Patient denies taking any diet/weight loss medications or blood thinners. EMMI education assisgned to patient on colonoscopy, this was explained and instructions given to patient. Suprep coupon given to pt.  

## 2017-04-16 ENCOUNTER — Encounter: Payer: Self-pay | Admitting: Gastroenterology

## 2017-04-29 ENCOUNTER — Ambulatory Visit (AMBULATORY_SURGERY_CENTER): Payer: 59 | Admitting: Gastroenterology

## 2017-04-29 ENCOUNTER — Encounter: Payer: Self-pay | Admitting: Gastroenterology

## 2017-04-29 ENCOUNTER — Other Ambulatory Visit: Payer: Self-pay

## 2017-04-29 VITALS — BP 125/90 | HR 62 | Temp 97.8°F | Resp 18 | Ht 72.0 in | Wt 158.0 lb

## 2017-04-29 DIAGNOSIS — D124 Benign neoplasm of descending colon: Secondary | ICD-10-CM | POA: Diagnosis not present

## 2017-04-29 DIAGNOSIS — D122 Benign neoplasm of ascending colon: Secondary | ICD-10-CM | POA: Diagnosis not present

## 2017-04-29 DIAGNOSIS — Z1211 Encounter for screening for malignant neoplasm of colon: Secondary | ICD-10-CM

## 2017-04-29 HISTORY — PX: COLONOSCOPY: SHX174

## 2017-04-29 MED ORDER — SODIUM CHLORIDE 0.9 % IV SOLN: 500.0000 mL | Freq: Once | INTRAVENOUS | Status: DC

## 2017-04-29 NOTE — Progress Notes (Signed)
[place] to room to assist during endoscopic procedure.  Patient ID and intended procedure confirmed with present staff. Received instructions for my participation in the procedure from the performing physician.

## 2017-04-29 NOTE — Progress Notes (Signed)
Pt drank "sip of Sprite" at 955.  Drank last fluids at 700 before that.  C Wall CRNA made aware.  Last use of marijuana 04-27-17

## 2017-04-29 NOTE — Patient Instructions (Signed)
**  handouts given on polyps and diverticulosis** **No NSAIDS (Advil, naproxen, ibuprofen, motrin) for 2 weeks**   YOU HAD AN ENDOSCOPIC PROCEDURE TODAY: Refer to the procedure report and other information in the discharge instructions given to you for any specific questions about what was found during the examination. If this information does not answer your questions, please call [place] office at [phone] to clarify.   YOU SHOULD EXPECT: Some feelings of bloating in the abdomen. Passage of more gas than usual. Walking can help get rid of the air that was put into your GI tract during the procedure and reduce the bloating. If you had a lower endoscopy (such as a colonoscopy or flexible sigmoidoscopy) you may notice spotting of blood in your stool or on the toilet paper. Some abdominal soreness may be present for a day or two, also.  DIET: Your first meal following the procedure should be a light meal and then it is ok to progress to your normal diet. A half-sandwich or bowl of soup is an example of a good first meal. Heavy or fried foods are harder to digest and may make you feel nauseous or bloated. Drink plenty of fluids but you should avoid alcoholic beverages for 24 hours. If you had a esophageal dilation, please see attached instructions for diet.    ACTIVITY: Your care partner should take you home directly after the procedure. You should plan to take it easy, moving slowly for the rest of the day. You can resume normal activity the day after the procedure however YOU SHOULD NOT DRIVE, use power tools, machinery or perform tasks that involve climbing or major physical exertion for 24 hours (because of the sedation medicines used during the test).   SYMPTOMS TO REPORT IMMEDIATELY: A gastroenterologist can be reached at any hour. Please call [phone]  for any of the following symptoms:  Following lower endoscopy (colonoscopy, flexible sigmoidoscopy) Excessive amounts of blood in the stool   Significant tenderness, worsening of abdominal pains  Swelling of the abdomen that is new, acute  Fever of 100 or higher    FOLLOW UP:  If any biopsies were taken you will be contacted by phone or by letter within the next 1-3 weeks. Call [phone]  if you have not heard about the biopsies in 3 weeks.  Please also call with any specific questions about appointments or follow up tests.

## 2017-04-29 NOTE — Op Note (Signed)
[facility] Patient Name: Christian Marks Procedure Date: 04/29/2017 11:09 AM MRN: [id number] Endoscopist: Remo Lipps P. Tyquasia Pant MD, MD Age: 52 Referring MD:  Date of Birth: Sep 08, 1965 Gender: Male Account #: [id number] Procedure:                Colonoscopy Indications:              Screening for colorectal malignant neoplasm, This                            is the patient's first colonoscopy Medicines:                Monitored Anesthesia Care Procedure:                Pre-Anesthesia Assessment:                           - Prior to the procedure, a History and Physical                            was performed, and patient medications and                            allergies were reviewed. The patient's tolerance of                            previous anesthesia was also reviewed. The risks                            and benefits of the procedure and the sedation                            options and risks were discussed with the patient.                            All questions were answered, and informed consent                            was obtained. Prior Anticoagulants: The patient has                            taken no previous anticoagulant or antiplatelet                            agents. ASA Grade Assessment: I - A normal, healthy                            patient. After reviewing the risks and benefits,                            the patient was deemed in satisfactory condition to                            undergo the procedure.  After obtaining informed consent, the colonoscope                            was passed under direct vision. Throughout the                            procedure, the patient's blood pressure, pulse, and                            oxygen saturations were monitored continuously. The                            Colonoscope was introduced through the anus and                            advanced to the the cecum,  identified by                            appendiceal orifice and ileocecal valve. The                            colonoscopy was performed without difficulty. The                            patient tolerated the procedure well. The quality                            of the bowel preparation was adequate. The                            ileocecal valve, appendiceal orifice, and rectum                            were photographed. Scope In: 11:14:26 AM Scope Out: 11:53:17 AM Scope Withdrawal Time: 0 hours 35 minutes 27 seconds  Total Procedure Duration: 0 hours 38 minutes 51 seconds  Findings:                 The perianal and digital rectal examinations were                            normal.                           The colon was tortuous.                           A 4 mm polyp was found in the ascending colon. The                            polyp was sessile. The polyp was removed with a                            cold snare. Resection and retrieval were complete.  A bulky large polyp (25-69mm) was found in the                            descending colon. The polyp was semi-pedunculated                            with a broad base. The polyp was injected with                            epinephrine and lifted appropriated. It was then                            removed in piecemeal fashion using a hot snare.                            Resection and retrieval were complete. To prevent                            bleeding after the polypectomy, two hemostatic                            clips were successfully placed across the defect.                            Area inferior to the polyp was tattooed with an                            injection of Spot (carbon black). Multiple passes                            with the Jabier Mutton net were needed to remove the polyp.                           Two sessile polyps were found in the descending                            colon.  The polyps were 3 to 4 mm in size. These                            polyps were removed with a cold snare. Resection                            and retrieval were complete.                           Multiple medium-mouthed diverticula were found in                            the sigmoid colon.                           The exam was otherwise without abnormality on  direct and retroflexion views. Complications:            No immediate complications. Estimated blood loss:                            Minimal. Estimated Blood Loss:     Estimated blood loss was minimal. Impression:               - Tortuous colon.                           - One 4 mm polyp in the ascending colon, removed                            with a cold snare. Resected and retrieved.                           - One large polyp in the descending colon, removed                            using injection-lift and a hot snare as above.                            Resected and retrieved. Clips were placed. Tattooed.                           - Two 3 to 4 mm polyps in the descending colon,                            removed with a cold snare. Resected and retrieved.                           - Diverticulosis in the sigmoid colon.                           - The examination was otherwise normal on direct                            and retroflexion views. Recommendation:           - Patient has a contact number available for                            emergencies. The signs and symptoms of potential                            delayed complications were discussed with the                            patient. Return to normal activities tomorrow.                            Written discharge instructions were provided to the  patient.                           - Resume previous diet.                           - Continue present medications.                           - Await pathology  results.                           - Repeat colonoscopy is recommended for                            surveillance. The colonoscopy date will be                            determined after pathology results from today's                            exam become available for review.                           - No ibuprofen, naproxen, or other non-steroidal                            anti-inflammatory drugs for 2 weeks after polyp                            removal. Remo Lipps P. Reynalda Canny MD, MD 04/29/2017 12:01:21 PM This report has been signed electronically.

## 2017-04-29 NOTE — Progress Notes (Signed)
Report given to PACU, vss 

## 2017-04-30 ENCOUNTER — Telehealth: Payer: Self-pay

## 2017-04-30 ENCOUNTER — Telehealth: Payer: Self-pay | Admitting: *Deleted

## 2017-04-30 NOTE — Telephone Encounter (Signed)
No answer for second post procedure call back. LEft message for patient to call with questions or concerns. SM

## 2017-04-30 NOTE — Telephone Encounter (Signed)
  Follow up Call-  Call back number 04/29/2017  Post procedure Call Back phone  # [phone]  Permission to leave phone message Yes  Some recent data might be hidden     Left message

## 2017-05-03 ENCOUNTER — Encounter: Payer: Self-pay | Admitting: Gastroenterology

## 2017-05-29 ENCOUNTER — Encounter: Payer: Self-pay | Admitting: Physician Assistant

## 2017-10-04 ENCOUNTER — Encounter: Payer: Self-pay | Admitting: Gastroenterology

## 2018-04-15 ENCOUNTER — Ambulatory Visit: Payer: 59 | Admitting: Family Medicine

## 2018-04-15 ENCOUNTER — Encounter: Payer: Self-pay | Admitting: Family Medicine

## 2018-04-15 VITALS — BP 112/65 | HR 69 | Temp 98.4°F | Resp 17 | Ht 72.0 in | Wt 163.0 lb

## 2018-04-15 DIAGNOSIS — L0231 Cutaneous abscess of buttock: Secondary | ICD-10-CM | POA: Diagnosis not present

## 2018-04-15 MED ORDER — DOXYCYCLINE HYCLATE 100 MG PO TABS: 100.0000 mg | ORAL_TABLET | Freq: Two times a day (BID) | ORAL | 0 refills | Status: DC

## 2018-04-15 MED ORDER — TRAMADOL HCL 50 MG PO TABS: 50.0000 mg | ORAL_TABLET | Freq: Four times a day (QID) | ORAL | 0 refills | Status: DC | PRN

## 2018-04-15 NOTE — Progress Notes (Signed)
Subjective:    Patient ID: Christian Marks, male    DOB: 10-Feb-1966, 53 y.o.   MRN: [id number]  HPI  Christian Marks is a 53 y.o. male Presents today for: Chief Complaint  Patient presents with  . boil    abcess in the butt area   Noticed bump in R lower buttock area, 6 days ago. Small bump that enlarged. Started draining 3-4 days ago, draining pus.   No prior abscess in area or other areas.   Tx: hot compress over the weekend multiple times per day initially, but not recently. otc cream - ?boil cream.   No fever, less sore today than last few days..    Patient Active Problem List   Diagnosis Date Noted  . Lung nodules   . Community acquired pneumonia 02/14/2014   No past medical history on file. Past Surgical History:  Procedure Laterality Date  . STOMACH SURGERY  10 years ago    stomach ulcer sx   No Known Allergies Prior to Admission medications   Not on File   Social History   Socioeconomic History  . Marital status: Legally Separated    Spouse name: Not on file  . Number of children: Not on file  . Years of education: Not on file  . Highest education level: Not on file  Occupational History  . Not on file  Social Needs  . Financial resource strain: Not on file  . Food insecurity:    Worry: Not on file    Inability: Not on file  . Transportation needs:    Medical: Not on file    Non-medical: Not on file  Tobacco Use  . Smoking status: Current Every Day Smoker    Packs/day: 1.00    Types: Cigarettes  . Smokeless tobacco: Never Used  Substance and Sexual Activity  . Alcohol use: Yes    Alcohol/week: 6.0 standard drinks    Types: 6 Cans of beer per week    Comment: 6 drinks per week per pt  . Drug use: Yes    Frequency: 2.0 times per week    Types: Marijuana    Comment: uses 2 times per week per pt- last use 04-27-17  . Sexual activity: Not on file  Lifestyle  . Physical activity:    Days per week: Not on file    Minutes per session: Not  on file  . Stress: Not on file  Relationships  . Social connections:    Talks on phone: Not on file    Gets together: Not on file    Attends religious service: Not on file    Active member of club or organization: Not on file    Attends meetings of clubs or organizations: Not on file    Relationship status: Not on file  . Intimate partner violence:    Fear of current or ex partner: Not on file    Emotionally abused: Not on file    Physically abused: Not on file    Forced sexual activity: Not on file  Other Topics Concern  . Not on file  Social History Narrative  . Not on file    Review of Systems Per HPI.     Objective:   Physical Exam Constitutional:      General: He is not in acute distress.    Appearance: He is well-developed.  HENT:     Head: Normocephalic and atraumatic.  Cardiovascular:     Rate and Rhythm: Normal rate.  Pulmonary:     Effort: Pulmonary effort is normal.  Genitourinary:   Neurological:     Mental Status: He is alert and oriented to person, place, and time.    Vitals:   04/15/18 1638  BP: 112/65  Pulse: 69  Resp: 17  Temp: 98.4 F (36.9 C)  TempSrc: Oral  SpO2: 98%  Weight: 163 lb (73.9 kg)  Height: 6' (1.829 m)    Risks (including but not limited to bleeding and infection), benefits, and alternatives discussed for incision and drainage of skin abscess..  Verbal consent obtained after any questions were answered. Landmarks noted, including proximity to anus.  Alcohol swab of her skin prior to anesthesia with lidocaine 1% plain, approximately 1.5 cc total used with 3 circumferential injections and central fluctuant area.  After anesthesia verified, center of most fluctuant area incised with 11 blade scalpel. Approximately 5 mm incision on skin surface, with depth of approximately 1 cm.  Explored with forceps, no further exudate expressed.  Approximately 1 cm of packing placed with Hypafix covering nonstick Telfa over the packing.  EBL less  than 0.2IO, no complications.     Assessment & Plan:    Christian Marks is a 53 y.o. male Abscess of right buttock - Plan: WOUND CULTURE, doxycycline (VIBRA-TABS) 100 MG tablet, traMADol (ULTRAM) 50 MG tablet Abscess anterolateral to anus, no anal involvement.  Area had begun to drain some on own prior to evaluation in office, but very small opening.  Still appeared to have central fluctuant area, with surrounding induration as above.  Second physician exam obtained.  Decided on I&D of abscess.  Procedure as above, without significant exudate expressed.  Gently explored anterior and posterior with forceps approx 1cm without apparent tunnel/further pockets.     -Packing was placed, but logistically may be difficult to keep that area clean.  Advised patient if bandage was soiled okay to remove it and just use warm soaks.  Otherwise warm compresses until follow-up tomorrow.   -Start doxycycline 100 mg twice daily, wound culture was obtained.  -Tramadol if needed for pain.  -Recheck 24 hours, handout given on abscess care, RTC/ER precautions   Meds ordered this encounter  Medications  . doxycycline (VIBRA-TABS) 100 MG tablet    Sig: Take 1 tablet (100 mg total) by mouth 2 (two) times daily.    Dispense:  20 tablet    Refill:  0  . traMADol (ULTRAM) 50 MG tablet    Sig: Take 1 tablet (50 mg total) by mouth every 6 (six) hours as needed.    Dispense:  10 tablet    Refill:  0   Patient Instructions    Start doxycycline -  1pill twice per day. Tramadol if needed for pain. Warm compresses to affected area 4-5 times per day. Recheck with Dr. Mitchel Honour tomorrow afternoon.  If any soiling of bandage - ok to remove it, otherwise we can remove bandage tomorrow.   Return to the clinic or go to the nearest emergency room if any of your symptoms worsen or new symptoms occur.   Skin Abscess  A skin abscess is an infected area on or under your skin that contains a collection of pus and other  material. An abscess may also be called a furuncle, carbuncle, or boil. An abscess can occur in or on almost any part of your body. Some abscesses break open (rupture) on their own. Most continue to get worse unless they are treated. The infection can spread deeper into the  body and eventually into your blood, which can make you feel ill. Treatment usually involves draining the abscess. What are the causes? An abscess occurs when germs, like bacteria, pass through your skin and cause an infection. This may be caused by:  A scrape or cut on your skin.  A puncture wound through your skin, including a needle injection or insect bite.  Blocked oil or sweat glands.  Blocked and infected hair follicles.  A cyst that forms beneath your skin (sebaceous cyst) and becomes infected. What increases the risk? This condition is more likely to develop in people who:  Have a weak body defense system (immune system).  Have diabetes.  Have dry and irritated skin.  Get frequent injections or use illegal IV drugs.  Have a foreign body in a wound, such as a splinter.  Have problems with their lymph system or veins. What are the signs or symptoms? Symptoms of this condition include:  A painful, firm bump under the skin.  A bump with pus at the top. This may break through the skin and drain. Other symptoms include:  Redness surrounding the abscess site.  Warmth.  Swelling of the lymph nodes (glands) near the abscess.  Tenderness.  A sore on the skin. How is this diagnosed? This condition may be diagnosed based on:  A physical exam.  Your medical history.  A sample of pus. This may be used to find out what is causing the infection.  Blood tests.  Imaging tests, such as an ultrasound, CT scan, or MRI. How is this treated? A small abscess that drains on its own may not need treatment. Treatment for larger abscesses may include:  Moist heat or heat pack applied to the area several  times a day.  A procedure to drain the abscess (incision and drainage).  Antibiotic medicines. For a severe abscess, you may first get antibiotics through an IV and then change to antibiotics by mouth. Follow these instructions at home: Medicines   Take over-the-counter and prescription medicines only as told by your health care provider.  If you were prescribed an antibiotic medicine, take it as told by your health care provider. Do not stop taking the antibiotic even if you start to feel better. Abscess care   If you have an abscess that has not drained, apply heat to the affected area. Use the heat source that your health care provider recommends, such as a moist heat pack or a heating pad. ? Place a towel between your skin and the heat source. ? Leave the heat on for 20-30 minutes. ? Remove the heat if your skin turns bright red. This is especially important if you are unable to feel pain, heat, or cold. You may have a greater risk of getting burned.  Follow instructions from your health care provider about how to take care of your abscess. Make sure you: ? Cover the abscess with a bandage (dressing). ? Change your dressing or gauze as told by your health care provider. ? Wash your hands with soap and water before you change the dressing or gauze. If soap and water are not available, use hand sanitizer.  Check your abscess every day for signs of a worsening infection. Check for: ? More redness, swelling, or pain. ? More fluid or blood. ? Warmth. ? More pus or a bad smell. General instructions  To avoid spreading the infection: ? Do not share personal care items, towels, or hot tubs with others. ? Avoid making skin  contact with other people.  Keep all follow-up visits as told by your health care provider. This is important. Contact a health care provider if you have:  More redness, swelling, or pain around your abscess.  More fluid or blood coming from your  abscess.  Warm skin around your abscess.  More pus or a bad smell coming from your abscess.  A fever.  Muscle aches.  Chills or a general ill feeling. Get help right away if you:  Have severe pain.  See red streaks on your skin spreading away from the abscess. Summary  A skin abscess is an infected area on or under your skin that contains a collection of pus and other material.  A small abscess that drains on its own may not need treatment.  Treatment for larger abscesses may include having a procedure to drain the abscess and taking an antibiotic. This information is not intended to replace advice given to you by your health care provider. Make sure you discuss any questions you have with your health care provider. Document Released: 11/22/2004 Document Revised: 03/28/2017 Document Reviewed: 03/28/2017 Elsevier Interactive Patient Education  [employer].   If you have lab work done today you will be contacted with your lab results within the next 2 weeks.  If you have not heard from [place] then please contact us. The fastest way to get your results is to register for My Chart.   IF you received an x-ray today, you will receive an invoice from [facility]. Please contact [facility] at [phone] with questions or concerns regarding your invoice.   IF you received labwork today, you will receive an invoice from [facility]. Please contact [facility] at [phone] with questions or concerns regarding your invoice.   Our billing staff will not be able to assist you with questions regarding bills from these companies.  You will be contacted with the lab results as soon as they are available. The fastest way to get your results is to activate your My Chart account. Instructions are located on the last page of this paperwork. If you have not heard from [place] regarding the results in 2 weeks, please contact this office.       Signed,   Merri Ray,  MD [facility].  04/15/18 6:47 PM

## 2018-04-15 NOTE — Patient Instructions (Addendum)
Start doxycycline -  1pill twice per day. Tramadol if needed for pain. Warm compresses to affected area 4-5 times per day. Recheck with Dr. Mitchel Honour tomorrow afternoon.  If any soiling of bandage - ok to remove it, otherwise we can remove bandage tomorrow.   Return to the clinic or go to the nearest emergency room if any of your symptoms worsen or new symptoms occur.   Skin Abscess  A skin abscess is an infected area on or under your skin that contains a collection of pus and other material. An abscess may also be called a furuncle, carbuncle, or boil. An abscess can occur in or on almost any part of your body. Some abscesses break open (rupture) on their own. Most continue to get worse unless they are treated. The infection can spread deeper into the body and eventually into your blood, which can make you feel ill. Treatment usually involves draining the abscess. What are the causes? An abscess occurs when germs, like bacteria, pass through your skin and cause an infection. This may be caused by:  A scrape or cut on your skin.  A puncture wound through your skin, including a needle injection or insect bite.  Blocked oil or sweat glands.  Blocked and infected hair follicles.  A cyst that forms beneath your skin (sebaceous cyst) and becomes infected. What increases the risk? This condition is more likely to develop in people who:  Have a weak body defense system (immune system).  Have diabetes.  Have dry and irritated skin.  Get frequent injections or use illegal IV drugs.  Have a foreign body in a wound, such as a splinter.  Have problems with their lymph system or veins. What are the signs or symptoms? Symptoms of this condition include:  A painful, firm bump under the skin.  A bump with pus at the top. This may break through the skin and drain. Other symptoms include:  Redness surrounding the abscess site.  Warmth.  Swelling of the lymph nodes (glands) near the  abscess.  Tenderness.  A sore on the skin. How is this diagnosed? This condition may be diagnosed based on:  A physical exam.  Your medical history.  A sample of pus. This may be used to find out what is causing the infection.  Blood tests.  Imaging tests, such as an ultrasound, CT scan, or MRI. How is this treated? A small abscess that drains on its own may not need treatment. Treatment for larger abscesses may include:  Moist heat or heat pack applied to the area several times a day.  A procedure to drain the abscess (incision and drainage).  Antibiotic medicines. For a severe abscess, you may first get antibiotics through an IV and then change to antibiotics by mouth. Follow these instructions at home: Medicines   Take over-the-counter and prescription medicines only as told by your health care provider.  If you were prescribed an antibiotic medicine, take it as told by your health care provider. Do not stop taking the antibiotic even if you start to feel better. Abscess care   If you have an abscess that has not drained, apply heat to the affected area. Use the heat source that your health care provider recommends, such as a moist heat pack or a heating pad. ? Place a towel between your skin and the heat source. ? Leave the heat on for 20-30 minutes. ? Remove the heat if your skin turns bright red. This is especially important if  you are unable to feel pain, heat, or cold. You may have a greater risk of getting burned.  Follow instructions from your health care provider about how to take care of your abscess. Make sure you: ? Cover the abscess with a bandage (dressing). ? Change your dressing or gauze as told by your health care provider. ? Wash your hands with soap and water before you change the dressing or gauze. If soap and water are not available, use hand sanitizer.  Check your abscess every day for signs of a worsening infection. Check for: ? More redness,  swelling, or pain. ? More fluid or blood. ? Warmth. ? More pus or a bad smell. General instructions  To avoid spreading the infection: ? Do not share personal care items, towels, or hot tubs with others. ? Avoid making skin contact with other people.  Keep all follow-up visits as told by your health care provider. This is important. Contact a health care provider if you have:  More redness, swelling, or pain around your abscess.  More fluid or blood coming from your abscess.  Warm skin around your abscess.  More pus or a bad smell coming from your abscess.  A fever.  Muscle aches.  Chills or a general ill feeling. Get help right away if you:  Have severe pain.  See red streaks on your skin spreading away from the abscess. Summary  A skin abscess is an infected area on or under your skin that contains a collection of pus and other material.  A small abscess that drains on its own may not need treatment.  Treatment for larger abscesses may include having a procedure to drain the abscess and taking an antibiotic. This information is not intended to replace advice given to you by your health care provider. Make sure you discuss any questions you have with your health care provider. Document Released: 11/22/2004 Document Revised: 03/28/2017 Document Reviewed: 03/28/2017 Elsevier Interactive Patient Education  [employer].   If you have lab work done today you will be contacted with your lab results within the next 2 weeks.  If you have not heard from [place] then please contact us. The fastest way to get your results is to register for My Chart.   IF you received an x-ray today, you will receive an invoice from [facility]. Please contact [facility] at [phone] with questions or concerns regarding your invoice.   IF you received labwork today, you will receive an invoice from [facility]. Please contact [facility] at [phone] with questions or  concerns regarding your invoice.   Our billing staff will not be able to assist you with questions regarding bills from these companies.  You will be contacted with the lab results as soon as they are available. The fastest way to get your results is to activate your My Chart account. Instructions are located on the last page of this paperwork. If you have not heard from [place] regarding the results in 2 weeks, please contact this office.

## 2018-04-16 ENCOUNTER — Ambulatory Visit: Payer: 59 | Admitting: Emergency Medicine

## 2018-04-17 LAB — WOUND CULTURE: Organism ID, Bacteria: NONE SEEN

## 2018-04-19 ENCOUNTER — Telehealth: Payer: Self-pay | Admitting: Family Medicine

## 2018-04-19 NOTE — Telephone Encounter (Signed)
Called to check status as was not seen in follow-up from initial visit.  Left message on his voicemail to let us know how he is doing.

## 2018-08-01 ENCOUNTER — Other Ambulatory Visit: Payer: Self-pay

## 2018-08-01 ENCOUNTER — Encounter: Payer: Self-pay | Admitting: Gastroenterology

## 2018-08-01 ENCOUNTER — Encounter: Payer: Self-pay | Admitting: Family Medicine

## 2018-08-01 ENCOUNTER — Ambulatory Visit (INDEPENDENT_AMBULATORY_CARE_PROVIDER_SITE_OTHER): Payer: 59 | Admitting: Family Medicine

## 2018-08-01 VITALS — BP 118/76 | HR 64 | Temp 98.1°F | Resp 14 | Ht 71.0 in | Wt 162.0 lb

## 2018-08-01 DIAGNOSIS — R911 Solitary pulmonary nodule: Secondary | ICD-10-CM | POA: Diagnosis not present

## 2018-08-01 DIAGNOSIS — Z13220 Encounter for screening for lipoid disorders: Secondary | ICD-10-CM

## 2018-08-01 DIAGNOSIS — Z131 Encounter for screening for diabetes mellitus: Secondary | ICD-10-CM | POA: Diagnosis not present

## 2018-08-01 DIAGNOSIS — H547 Unspecified visual loss: Secondary | ICD-10-CM | POA: Diagnosis not present

## 2018-08-01 DIAGNOSIS — Z13228 Encounter for screening for other metabolic disorders: Secondary | ICD-10-CM

## 2018-08-01 DIAGNOSIS — D72819 Decreased white blood cell count, unspecified: Secondary | ICD-10-CM | POA: Diagnosis not present

## 2018-08-01 DIAGNOSIS — R200 Anesthesia of skin: Secondary | ICD-10-CM

## 2018-08-01 DIAGNOSIS — M7989 Other specified soft tissue disorders: Secondary | ICD-10-CM

## 2018-08-01 DIAGNOSIS — Z1211 Encounter for screening for malignant neoplasm of colon: Secondary | ICD-10-CM

## 2018-08-01 DIAGNOSIS — Z0000 Encounter for general adult medical examination without abnormal findings: Secondary | ICD-10-CM

## 2018-08-01 DIAGNOSIS — Z0001 Encounter for general adult medical examination with abnormal findings: Secondary | ICD-10-CM

## 2018-08-01 DIAGNOSIS — M79641 Pain in right hand: Secondary | ICD-10-CM

## 2018-08-01 DIAGNOSIS — N529 Male erectile dysfunction, unspecified: Secondary | ICD-10-CM

## 2018-08-01 NOTE — Progress Notes (Signed)
Subjective:    Patient ID: Christian Marks, male    DOB: 1965-11-29, 53 y.o.   MRN: [id number]  HPI Christian Marks is a 53 y.o. male Presents today for: Chief Complaint  Patient presents with  . Annual Exam    Patient is here for his annual physical  but also have problems with right hand. Throbbing, swelling and go numb at night. To shack hand it hurts. This has been going on for 6-9 months on/off   Presents for annual physical exam as well as right hand issue as above  Right hand pain: Initially discussed in January 2019 with [place].  At that time he was having some right hand pain with reported swelling, difficulty writing,, initially both hands but then right hand primarily at that time.  Was treated with prednisone, normal sed rate, B12, glucose.  Plan for follow-up in few weeks but has not been seen since that time regarding hand pain. Uric acid 3.8 in 2016.  Reports swelling improved but pain remained. Swelling comes and goes. Left arm goes numb at times, and gets weak - entire arm, every other day - better in the morning with blood circulating/shaking in the morning.  Sx's for a year and a half. No changes during that time.  No meds to treat.  Feels better with some ice on area.  No neck pain.  R hand dominant.  Material handler - some lifting/physical work.  Does live with right arm under pillow at night.  Notices some of the numbness of arm in the morning.  Erectile dysfunction. Past year with difficulty maintaining erection. Able to achieve morning erection.  No meds.  Same sexual partner for awhile. Single.  Leukopenia: Mild low WBC of 2.8 back in January 2019.  Previously 3.1 March 06, 2017.   No night sweats, fever, weight loss.   Pulmonary nodules: CT angio/chest in December 2015 for right-sided chest pain and back pain.2 areas of nodular airspace disease in the superior segment of right lower lobe.  2.5 x 2.8 cm, 2.5 x 2.3 cm.  There is also a  1.5 x 1.5 cm nodular area of airspace disease in the left lower lobe.  Treated with antibiotics, Augmentin, plan for January 2016 repeat CT scan but that has not been performed. No hemoptysis/cough/dyspnea. No fever/nt sweats/wt loss.  Wt Readings from Last 3 Encounters:  08/01/18 162 lb (73.5 kg)  04/15/18 163 lb (73.9 kg)  04/29/17 158 lb (71.7 kg)    Cancer screening: Colonoscopy 04/29/2017, few polyps, diverticulosis, Dr. Havery Moros Prostate: Normal PSA of 0.7 in January 2019. Blood work only. Declines DRE.  Lung: Few abnormal areas thought to be infectious etiology in 2015, has not had repeat CT scanning.  Does have history of tobacco use - 1ppd. 35 pack yr hx. Tried patch at times to quit - works ok. Plans on trying to quit again   Immunization History  Administered Date(s) Administered  . Influenza,inj,Quad PF,6+ Mos 02/15/2014  tetanus: agrees today Shingles: agrees to vaccine.    Depression screen [facility] 2/9 08/01/2018 04/15/2018 03/22/2017 03/06/2017 02/24/2014  Decreased Interest 0 0 0 0 0  Down, Depressed, Hopeless 0 0 0 0 0  PHQ - 2 Score 0 0 0 0 0  Altered sleeping - 0 - - -  Tired, decreased energy - 0 - - -  Change in appetite - 0 - - -  Feeling bad or failure about yourself  - 0 - - -  Trouble concentrating - 0 - - -  Moving slowly or fidgety/restless - 0 - - -  Suicidal thoughts - 0 - - -  PHQ-9 Score - 0 - - -  Difficult doing work/chores - Not difficult at all - - -     Visual Acuity Screening   Right eye Left eye Both eyes  Without correction:     With correction: 20/40 20/10 20/25   optho: no eye exam recently. Some itching feeling in eyes.  Wears safety goggles.   Dental: no recent visit. Would like some names.   Exercise: - physical job.    Patient Active Problem List   Diagnosis Date Noted  . Lung nodules   . Community acquired pneumonia 02/14/2014   No past medical history on file. Past Surgical History:  Procedure Laterality Date  . STOMACH  SURGERY  10 years ago    stomach ulcer sx   No Known Allergies Prior to Admission medications   Not on File   Social History   Socioeconomic History  . Marital status: Legally Separated    Spouse name: Not on file  . Number of children: Not on file  . Years of education: Not on file  . Highest education level: Not on file  Occupational History  . Not on file  Social Needs  . Financial resource strain: Not on file  . Food insecurity:    Worry: Not on file    Inability: Not on file  . Transportation needs:    Medical: Not on file    Non-medical: Not on file  Tobacco Use  . Smoking status: Current Every Day Smoker    Packs/day: 1.00    Types: Cigarettes  . Smokeless tobacco: Never Used  Substance and Sexual Activity  . Alcohol use: Yes    Alcohol/week: 6.0 standard drinks    Types: 6 Cans of beer per week    Comment: 6 drinks per week per pt  . Drug use: Yes    Frequency: 2.0 times per week    Types: Marijuana    Comment: uses 2 times per week per pt- last use 04-27-17  . Sexual activity: Not on file  Lifestyle  . Physical activity:    Days per week: Not on file    Minutes per session: Not on file  . Stress: Not on file  Relationships  . Social connections:    Talks on phone: Not on file    Gets together: Not on file    Attends religious service: Not on file    Active member of club or organization: Not on file    Attends meetings of clubs or organizations: Not on file    Relationship status: Not on file  . Intimate partner violence:    Fear of current or ex partner: Not on file    Emotionally abused: Not on file    Physically abused: Not on file    Forced sexual activity: Not on file  Other Topics Concern  . Not on file  Social History Narrative  . Not on file    Review of Systems 13 point review of systems per patient health survey noted.  Negative other than as indicated above or in HPI.      Objective:   Physical Exam Vitals signs reviewed.   Constitutional:      Appearance: He is well-developed.  HENT:     Head: Normocephalic and atraumatic.     Right Ear: External ear normal.     Left Ear: External ear normal.  Eyes:     Conjunctiva/sclera: Conjunctivae normal.     Pupils: Pupils are equal, round, and reactive to light.  Neck:     Musculoskeletal: Normal range of motion and neck supple.     Thyroid: No thyromegaly.  Cardiovascular:     Rate and Rhythm: Normal rate and regular rhythm.     Heart sounds: Normal heart sounds.  Pulmonary:     Effort: Pulmonary effort is normal. No respiratory distress.     Breath sounds: Normal breath sounds. No wheezing.  Abdominal:     General: There is no distension.     Palpations: Abdomen is soft.     Tenderness: There is no abdominal tenderness.  Musculoskeletal: Normal range of motion.        General: No tenderness.     Comments: Pain-free range of motion of cervical spine, right shoulder.  Does not reproduce arm symptoms.  Equal grip strength, upper arm strength right versus left.  Negative Tinel's at the wrist, negative Tinel's at the brachial plexus on the right.  Notes discomfort along the PCP greater than PIP joints but no focal swelling of hand or joints was appreciated.  Full range of motion of hand joints.  Lymphadenopathy:     Cervical: No cervical adenopathy.  Skin:    General: Skin is warm and dry.  Neurological:     Mental Status: He is alert and oriented to person, place, and time.     Deep Tendon Reflexes: Reflexes are normal and symmetric.  Psychiatric:        Behavior: Behavior normal.    Vitals:   08/01/18 1333  BP: 118/76  Pulse: 64  Resp: 14  Temp: 98.1 F (36.7 C)  TempSrc: Oral  SpO2: 98%  Weight: 162 lb (73.5 kg)  Height: 5\' 11"  (1.803 m)       Assessment & Plan:   Dorrien Grunder is a 53 y.o. male Annual physical exam - Plan: Comprehensive metabolic panel  - -anticipatory guidance as below in AVS, screening labs above. Health maintenance  items as above in HPI discussed/recommended as applicable.    -Options for dentist follow-up discussed  -Smoking cessation discussed, handout given  Special screening for malignant neoplasms, colon - Plan: Ambulatory referral to Gastroenterology  Screening for metabolic disorder - Plan: Comprehensive metabolic panel  Screening for lipid disorders - Plan: Lipid Panel  Erectile dysfunction, unspecified erectile dysfunction type - Plan: Testosterone, Free, Total, SHBG  -Initially start with testosterone testing, may need to be repeated between 8 and 10 AM depending on levels.  Consider medication such as Viagra or Levitra but potential side effects and risks were discussed.  Plan discussion further at follow-up visit  Leukopenia, unspecified type - Plan: CBC  -Borderline noted previously, repeat CBC  Solitary pulmonary nodule - Plan: CT CHEST NODULE FOLLOW UP LOW DOSE W/O  -Possible infectious cause previously, will check repeat low-dose CT for nodule follow-up.  Denies concerning symptoms at this time but still tobacco use history. Decreased visual acuity - Plan: Ambulatory referral to Ophthalmology  Screening for diabetes mellitus - Plan: Comprehensive metabolic panel   Right arm numbness  -Possible compressive neuropathy is resolved in the morning after sleeping.  Cervical cause also possible.  Reassuring exam, no weakness is appreciated  -Avoid sleeping on arm, evaluate further at follow-up, consider neuro eval if persistent  Right hand pain - Plan: Sedimentation Rate, Uric Acid, Rheumatoid factor, DG Hand Complete Right Swelling of right hand - Plan: Sedimentation Rate, Uric Acid,  Rheumatoid factor  -Intermittent symptoms.  Previously treated for possible inflammatory arthropathy and some improvement with prednisone.  Check sed rate, uric acid, rheumatoid factor and follow-up to discuss further.  Consider Ortho versus rheumatology eval if persistent   No orders of the defined types  were placed in this encounter.  Patient Instructions    Let me know if I can help with quitting smoking. I will refer you to eye doctor. Over the counter pataday or zaditor or itchy eyes. I will order a repeat blood count for prior lo reading and other labs. Please follow up in next few weeks to review these labs and other issues we discussed today.  For arm issues, try to avoid sleeping with arm under pillow. Tylenol if needed for hand pain for now. Recheck in next 2 weeks.   Here are a few dentist options if needed.  [facility]  Address: [address], [place], [place] [address]  [email or web address]  Phone: [phone]   [facility]  Address: [address], [place], [place] [address] Phone: [phone]   Steps to Quit Smoking  Smoking tobacco can be harmful to your health and can affect almost every organ in your body. Smoking puts you, and those around you, at risk for developing many serious chronic diseases. Quitting smoking is difficult, but it is one of the best things that you can do for your health. It is never too late to quit. What are the benefits of quitting smoking? When you quit smoking, you lower your risk of developing serious diseases and conditions, such as:  Lung cancer or lung disease, such as COPD.  Heart disease.  Stroke.  Heart attack.  Infertility.  Osteoporosis and bone fractures. Additionally, symptoms such as coughing, wheezing, and shortness of breath may get better when you quit. You may also find that you get sick less often because your body is stronger at fighting off colds and infections. If you are pregnant, quitting smoking can help to reduce your chances of having a baby of low birth weight. How do I get ready to quit? When you decide to quit smoking, create a plan to make sure that you are successful. Before you quit:  Pick a date to quit. Set a date within the next two weeks to give you time to  prepare.  Write down the reasons why you are quitting. Keep this list in places where you will see it often, such as on your bathroom mirror or in your car or wallet.  Identify the people, places, things, and activities that make you want to smoke (triggers) and avoid them. Make sure to take these actions: ? Throw away all cigarettes at home, at work, and in your car. ? Throw away smoking accessories, such as Scientist, research (medical). ? Clean your car and make sure to empty the ashtray. ? Clean your home, including curtains and carpets.  Tell your family, friends, and coworkers that you are quitting. Support from your loved ones can make quitting easier.  Talk with your health care provider about your options for quitting smoking.  Find out what treatment options are covered by your health insurance. What strategies can I use to quit smoking? Talk with your healthcare provider about different strategies to quit smoking. Some strategies include:  Quitting smoking altogether instead of gradually lessening how much you smoke over a period of time. Research shows that quitting "cold [place]" is more successful than gradually quitting.  Attending in-person counseling  to help you build problem-solving skills. You are more likely to have success in quitting if you attend several counseling sessions. Even short sessions of 10 minutes can be effective.  Finding resources and support systems that can help you to quit smoking and remain smoke-free after you quit. These resources are most helpful when you use them often. They can include: ? Online chats with a Social worker. ? Telephone quitlines. ? Careers information officer. ? Support groups or group counseling. ? Text messaging programs. ? Mobile phone applications.  Taking medicines to help you quit smoking. (If you are pregnant or breastfeeding, talk with your health care provider first.) Some medicines contain nicotine and some do not. Both types of  medicines help with cravings, but the medicines that include nicotine help to relieve withdrawal symptoms. Your health care provider may recommend: ? Nicotine patches, gum, or lozenges. ? Nicotine inhalers or sprays. ? Non-nicotine medicine that is taken by mouth. Talk with your health care provider about combining strategies, such as taking medicines while you are also receiving in-person counseling. Using these two strategies together makes you more likely to succeed in quitting than if you used either strategy on its own. If you are pregnant or breastfeeding, talk with your health care provider about finding counseling or other support strategies to quit smoking. Do not take medicine to help you quit smoking unless told to do so by your health care provider. What things can I do to make it easier to quit? Quitting smoking might feel overwhelming at first, but there is a lot that you can do to make it easier. Take these important actions:  Reach out to your family and friends and ask that they support and encourage you during this time. Call telephone quitlines, reach out to support groups, or work with a counselor for support.  Ask people who smoke to avoid smoking around you.  Avoid places that trigger you to smoke, such as bars, parties, or smoke-break areas at work.  Spend time around people who do not smoke.  Lessen stress in your life, because stress can be a smoking trigger for some people. To lessen stress, try: ? Exercising regularly. ? Deep-breathing exercises. ? Yoga. ? Meditating. ? Performing a body scan. This involves closing your eyes, scanning your body from head to toe, and noticing which parts of your body are particularly tense. Purposefully relax the muscles in those areas.  Download or purchase mobile phone or tablet apps (applications) that can help you stick to your quit plan by providing reminders, tips, and encouragement. There are many free apps, such as QuitGuide  from the [employer] Office manager for Disease Control and Prevention). You can find other support for quitting smoking (smoking cessation) through smokefree.gov and other websites. How will I feel when I quit smoking? Within the first 24 hours of quitting smoking, you may start to feel some withdrawal symptoms. These symptoms are usually most noticeable 2-3 days after quitting, but they usually do not last beyond 2-3 weeks. Changes or symptoms that you might experience include:  Mood swings.  Restlessness, anxiety, or irritation.  Difficulty concentrating.  Dizziness.  Strong cravings for sugary foods in addition to nicotine.  Mild weight gain.  Constipation.  Nausea.  Coughing or a sore throat.  Changes in how your medicines work in your body.  A depressed mood.  Difficulty sleeping (insomnia). After the first 2-3 weeks of quitting, you may start to notice more positive results, such as:  Improved sense of smell  and taste.  Decreased coughing and sore throat.  Slower heart rate.  Lower blood pressure.  Clearer skin.  The ability to breathe more easily.  Fewer sick days. Quitting smoking is very challenging for most people. Do not get discouraged if you are not successful the first time. Some people need to make many attempts to quit before they achieve long-term success. Do your best to stick to your quit plan, and talk with your health care provider if you have any questions or concerns. This information is not intended to replace advice given to you by your health care provider. Make sure you discuss any questions you have with your health care provider. Document Released: 02/06/2001 Document Revised: 09/18/2016 Document Reviewed: 06/29/2014 Elsevier Interactive Patient Education  2019 [place] you healthy  Get these tests  Blood pressure- Have your blood pressure checked once a year by your healthcare provider.  Normal blood pressure is 120/80  Weight-  Have your body mass index (BMI) calculated to screen for obesity.  BMI is a measure of body fat based on height and weight. You can also calculate your own BMI at ViewBanking.si.  Cholesterol- Have your cholesterol checked every year.  Diabetes- Have your blood sugar checked regularly if you have high blood pressure, high cholesterol, have a family history of diabetes or if you are overweight.  Screening for Colon Cancer- Colonoscopy starting at age 24.  Screening may begin sooner depending on your family history and other health conditions. Follow up colonoscopy as directed by your Gastroenterologist.  Screening for Prostate Cancer- Both blood work (PSA) and a rectal exam help screen for Prostate Cancer.  Screening begins at age 68 with African-American men and at age 69 with Caucasian men.  Screening may begin sooner depending on your family history.  Take these medicines  Aspirin- One aspirin daily can help prevent Heart disease and Stroke.  Flu shot- Every fall.  Tetanus- Every 10 years.  Zostavax- Once after the age of 77 to prevent Shingles.  Pneumonia shot- Once after the age of 78; if you are younger than 45, ask your healthcare provider if you need a Pneumonia shot.  Take these steps  Don't smoke- If you do smoke, talk to your doctor about quitting.  For tips on how to quit, go to www.smokefree.gov or call 1-800-QUIT-NOW.  Be physically active- Exercise 5 days a week for at least 30 minutes.  If you are not already physically active start slow and gradually work up to 30 minutes of moderate physical activity.  Examples of moderate activity include walking briskly, mowing the yard, dancing, swimming, bicycling, etc.  Eat a healthy diet- Eat a variety of healthy food such as fruits, vegetables, low fat milk, low fat cheese, yogurt, lean meant, poultry, fish, beans, tofu, etc. For more information go to www.thenutritionsource.org  Drink alcohol in moderation- Limit  alcohol intake to less than two drinks a day. Never drink and drive.  Dentist- Brush and floss twice daily; visit your dentist twice a year.  Depression- Your emotional health is as important as your physical health. If you're feeling down, or losing interest in things you would normally enjoy please talk to your healthcare provider.  Eye exam- Visit your eye doctor every year.  Safe sex- If you may be exposed to a sexually transmitted infection, use a condom.  Seat belts- Seat belts can save your life; always wear one.  Smoke/Carbon Monoxide detectors- These detectors need to be installed on the  appropriate level of your home.  Replace batteries at least once a year.  Skin cancer- When out in the sun, cover up and use sunscreen 15 SPF or higher.  Violence- If anyone is threatening you, please tell your healthcare provider.  Living Will/ Health care power of attorney- Speak with your healthcare provider and family.  If you have lab work done today you will be contacted with your lab results within the next 2 weeks.  If you have not heard from [place] then please contact us. The fastest way to get your results is to register for My Chart.   IF you received an x-ray today, you will receive an invoice from [facility]. Please contact [facility] at [phone] with questions or concerns regarding your invoice.   IF you received labwork today, you will receive an invoice from [facility]. Please contact [facility] at [phone] with questions or concerns regarding your invoice.   Our billing staff will not be able to assist you with questions regarding bills from these companies.  You will be contacted with the lab results as soon as they are available. The fastest way to get your results is to activate your My Chart account. Instructions are located on the last page of this paperwork. If you have not heard from [place] regarding the results in 2 weeks, please contact this office.        Signed,   Merri Ray, MD [facility].  08/02/18 5:17 PM

## 2018-08-01 NOTE — Patient Instructions (Addendum)
Let me know if I can help with quitting smoking. I will refer you to eye doctor. Over the counter pataday or zaditor or itchy eyes. I will order a repeat blood count for prior lo reading and other labs. Please follow up in next few weeks to review these labs and other issues we discussed today.  For arm issues, try to avoid sleeping with arm under pillow. Tylenol if needed for hand pain for now. Recheck in next 2 weeks.   Here are a few dentist options if needed.  [facility]  Address: [place], [place], [place] [address]  [email or web address]  Phone: [phone]   [facility]  Address: [address], [place], [place] [address] Phone: [phone]   Steps to Quit Smoking  Smoking tobacco can be harmful to your health and can affect almost every organ in your body. Smoking puts you, and those around you, at risk for developing many serious chronic diseases. Quitting smoking is difficult, but it is one of the best things that you can do for your health. It is never too late to quit. What are the benefits of quitting smoking? When you quit smoking, you lower your risk of developing serious diseases and conditions, such as:  Lung cancer or lung disease, such as COPD.  Heart disease.  Stroke.  Heart attack.  Infertility.  Osteoporosis and bone fractures. Additionally, symptoms such as coughing, wheezing, and shortness of breath may get better when you quit. You may also find that you get sick less often because your body is stronger at fighting off colds and infections. If you are pregnant, quitting smoking can help to reduce your chances of having a baby of low birth weight. How do I get ready to quit? When you decide to quit smoking, create a plan to make sure that you are successful. Before you quit:  Pick a date to quit. Set a date within the next two weeks to give you time to prepare.  Write down the reasons why you are quitting. Keep  this list in places where you will see it often, such as on your bathroom mirror or in your car or wallet.  Identify the people, places, things, and activities that make you want to smoke (triggers) and avoid them. Make sure to take these actions: ? Throw away all cigarettes at home, at work, and in your car. ? Throw away smoking accessories, such as Scientist, research (medical). ? Clean your car and make sure to empty the ashtray. ? Clean your home, including curtains and carpets.  Tell your family, friends, and coworkers that you are quitting. Support from your loved ones can make quitting easier.  Talk with your health care provider about your options for quitting smoking.  Find out what treatment options are covered by your health insurance. What strategies can I use to quit smoking? Talk with your healthcare provider about different strategies to quit smoking. Some strategies include:  Quitting smoking altogether instead of gradually lessening how much you smoke over a period of time. Research shows that quitting "cold [place]" is more successful than gradually quitting.  Attending in-person counseling to help you build problem-solving skills. You are more likely to have success in quitting if you attend several counseling sessions. Even short sessions of 10 minutes can be effective.  Finding resources and support systems that can help you to quit smoking and remain smoke-free after you quit. These resources are most helpful when you use them  often. They can include: ? Online chats with a Social worker. ? Telephone quitlines. ? Careers information officer. ? Support groups or group counseling. ? Text messaging programs. ? Mobile phone applications.  Taking medicines to help you quit smoking. (If you are pregnant or breastfeeding, talk with your health care provider first.) Some medicines contain nicotine and some do not. Both types of medicines help with cravings, but the medicines that include  nicotine help to relieve withdrawal symptoms. Your health care provider may recommend: ? Nicotine patches, gum, or lozenges. ? Nicotine inhalers or sprays. ? Non-nicotine medicine that is taken by mouth. Talk with your health care provider about combining strategies, such as taking medicines while you are also receiving in-person counseling. Using these two strategies together makes you more likely to succeed in quitting than if you used either strategy on its own. If you are pregnant or breastfeeding, talk with your health care provider about finding counseling or other support strategies to quit smoking. Do not take medicine to help you quit smoking unless told to do so by your health care provider. What things can I do to make it easier to quit? Quitting smoking might feel overwhelming at first, but there is a lot that you can do to make it easier. Take these important actions:  Reach out to your family and friends and ask that they support and encourage you during this time. Call telephone quitlines, reach out to support groups, or work with a counselor for support.  Ask people who smoke to avoid smoking around you.  Avoid places that trigger you to smoke, such as bars, parties, or smoke-break areas at work.  Spend time around people who do not smoke.  Lessen stress in your life, because stress can be a smoking trigger for some people. To lessen stress, try: ? Exercising regularly. ? Deep-breathing exercises. ? Yoga. ? Meditating. ? Performing a body scan. This involves closing your eyes, scanning your body from head to toe, and noticing which parts of your body are particularly tense. Purposefully relax the muscles in those areas.  Download or purchase mobile phone or tablet apps (applications) that can help you stick to your quit plan by providing reminders, tips, and encouragement. There are many free apps, such as QuitGuide from the [employer] Office manager for Disease Control and Prevention).  You can find other support for quitting smoking (smoking cessation) through smokefree.gov and other websites. How will I feel when I quit smoking? Within the first 24 hours of quitting smoking, you may start to feel some withdrawal symptoms. These symptoms are usually most noticeable 2-3 days after quitting, but they usually do not last beyond 2-3 weeks. Changes or symptoms that you might experience include:  Mood swings.  Restlessness, anxiety, or irritation.  Difficulty concentrating.  Dizziness.  Strong cravings for sugary foods in addition to nicotine.  Mild weight gain.  Constipation.  Nausea.  Coughing or a sore throat.  Changes in how your medicines work in your body.  A depressed mood.  Difficulty sleeping (insomnia). After the first 2-3 weeks of quitting, you may start to notice more positive results, such as:  Improved sense of smell and taste.  Decreased coughing and sore throat.  Slower heart rate.  Lower blood pressure.  Clearer skin.  The ability to breathe more easily.  Fewer sick days. Quitting smoking is very challenging for most people. Do not get discouraged if you are not successful the first time. Some people need to make many attempts to  quit before they achieve long-term success. Do your best to stick to your quit plan, and talk with your health care provider if you have any questions or concerns. This information is not intended to replace advice given to you by your health care provider. Make sure you discuss any questions you have with your health care provider. Document Released: 02/06/2001 Document Revised: 09/18/2016 Document Reviewed: 06/29/2014 Elsevier Interactive Patient Education  2019 [place] you healthy  Get these tests  Blood pressure- Have your blood pressure checked once a year by your healthcare provider.  Normal blood pressure is 120/80  Weight- Have your body mass index (BMI) calculated to screen for  obesity.  BMI is a measure of body fat based on height and weight. You can also calculate your own BMI at ViewBanking.si.  Cholesterol- Have your cholesterol checked every year.  Diabetes- Have your blood sugar checked regularly if you have high blood pressure, high cholesterol, have a family history of diabetes or if you are overweight.  Screening for Colon Cancer- Colonoscopy starting at age 60.  Screening may begin sooner depending on your family history and other health conditions. Follow up colonoscopy as directed by your Gastroenterologist.  Screening for Prostate Cancer- Both blood work (PSA) and a rectal exam help screen for Prostate Cancer.  Screening begins at age 59 with African-American men and at age 10 with Caucasian men.  Screening may begin sooner depending on your family history.  Take these medicines  Aspirin- One aspirin daily can help prevent Heart disease and Stroke.  Flu shot- Every fall.  Tetanus- Every 10 years.  Zostavax- Once after the age of 86 to prevent Shingles.  Pneumonia shot- Once after the age of 44; if you are younger than 5, ask your healthcare provider if you need a Pneumonia shot.  Take these steps  Don't smoke- If you do smoke, talk to your doctor about quitting.  For tips on how to quit, go to www.smokefree.gov or call 1-800-QUIT-NOW.  Be physically active- Exercise 5 days a week for at least 30 minutes.  If you are not already physically active start slow and gradually work up to 30 minutes of moderate physical activity.  Examples of moderate activity include walking briskly, mowing the yard, dancing, swimming, bicycling, etc.  Eat a healthy diet- Eat a variety of healthy food such as fruits, vegetables, low fat milk, low fat cheese, yogurt, lean meant, poultry, fish, beans, tofu, etc. For more information go to www.thenutritionsource.org  Drink alcohol in moderation- Limit alcohol intake to less than two drinks a day. Never drink and  drive.  Dentist- Brush and floss twice daily; visit your dentist twice a year.  Depression- Your emotional health is as important as your physical health. If you're feeling down, or losing interest in things you would normally enjoy please talk to your healthcare provider.  Eye exam- Visit your eye doctor every year.  Safe sex- If you may be exposed to a sexually transmitted infection, use a condom.  Seat belts- Seat belts can save your life; always wear one.  Smoke/Carbon Monoxide detectors- These detectors need to be installed on the appropriate level of your home.  Replace batteries at least once a year.  Skin cancer- When out in the sun, cover up and use sunscreen 15 SPF or higher.  Violence- If anyone is threatening you, please tell your healthcare provider.  Living Will/ Health care power of attorney- Speak with your healthcare provider and family.  If you have lab work done today you will be contacted with your lab results within the next 2 weeks.  If you have not heard from us then please contact us. The fastest way to get your results is to register for My Chart. ° ° °IF you received an x-ray today, you will receive an invoice from [facility]. Please contact [facility] at [phone] with questions or concerns regarding your invoice.  ° °IF you received labwork today, you will receive an invoice from [facility]. Please contact [facility] at [phone] with questions or concerns regarding your invoice.  ° °Our billing staff will not be able to assist you with questions regarding bills from these companies. ° °You will be contacted with the lab results as soon as they are available. The fastest way to get your results is to activate your My Chart account. Instructions are located on the last page of this paperwork. If you have not heard from us regarding the results in 2 weeks, please contact this office. °  ° ° ° °

## 2018-08-04 LAB — COMPREHENSIVE METABOLIC PANEL
ALT: 29 IU/L (ref 0–44)
AST: 26 IU/L (ref 0–40)
Albumin/Globulin Ratio: 1.7 (ref 1.2–2.2)
Albumin: 4.5 g/dL (ref 3.8–4.9)
Alkaline Phosphatase: 80 IU/L (ref 39–117)
BUN/Creatinine Ratio: 11 (ref 9–20)
BUN: 12 mg/dL (ref 6–24)
Bilirubin Total: 0.3 mg/dL (ref 0.0–1.2)
CO2: 20 mmol/L (ref 20–29)
Calcium: 9.6 mg/dL (ref 8.7–10.2)
Chloride: 107 mmol/L — ABNORMAL HIGH (ref 96–106)
Creatinine, Ser: 1.06 mg/dL (ref 0.76–1.27)
GFR calc Af Amer: 92 mL/min/{1.73_m2} (ref >59)
GFR calc non Af Amer: 80 mL/min/{1.73_m2} (ref >59)
Globulin, Total: 2.7 g/dL (ref 1.5–4.5)
Glucose: 93 mg/dL (ref 65–99)
Potassium: 4.2 mmol/L (ref 3.5–5.2)
Sodium: 142 mmol/L (ref 134–144)
Total Protein: 7.2 g/dL (ref 6.0–8.5)

## 2018-08-04 LAB — URIC ACID: Uric Acid: 4.7 mg/dL (ref 3.7–8.6)

## 2018-08-04 LAB — TESTOSTERONE, FREE, TOTAL, SHBG
Sex Hormone Binding: 42.9 nmol/L (ref 19.3–76.4)
Testosterone, Free: 6.9 pg/mL — ABNORMAL LOW (ref 7.2–24.0)
Testosterone: 444 ng/dL (ref 264–916)

## 2018-08-04 LAB — LIPID PANEL
Chol/HDL Ratio: 2.6 ratio (ref 0.0–5.0)
Cholesterol, Total: 180 mg/dL (ref 100–199)
HDL: 68 mg/dL (ref >39)
LDL Calculated: 84 mg/dL (ref 0–99)
Triglycerides: 138 mg/dL (ref 0–149)
VLDL Cholesterol Cal: 28 mg/dL (ref 5–40)

## 2018-08-04 LAB — CBC
Hematocrit: 41.1 % (ref 37.5–51.0)
Hemoglobin: 13.9 g/dL (ref 13.0–17.7)
MCH: 32.3 pg (ref 26.6–33.0)
MCHC: 33.8 g/dL (ref 31.5–35.7)
MCV: 96 fL (ref 79–97)
Platelets: 191 10*3/uL (ref 150–450)
RBC: 4.3 x10E6/uL (ref 4.14–5.80)
RDW: 14 % (ref 11.6–15.4)
WBC: 2.8 10*3/uL — ABNORMAL LOW (ref 3.4–10.8)

## 2018-08-04 LAB — RHEUMATOID FACTOR: Rheumatoid fact SerPl-aCnc: <10 IU/mL (ref 0.0–13.9)

## 2018-08-04 LAB — SEDIMENTATION RATE: Sed Rate: 6 mm/hr (ref 0–30)

## 2018-08-22 ENCOUNTER — Other Ambulatory Visit: Payer: Self-pay

## 2018-08-22 ENCOUNTER — Encounter: Payer: Self-pay | Admitting: Family Medicine

## 2018-08-22 ENCOUNTER — Ambulatory Visit (INDEPENDENT_AMBULATORY_CARE_PROVIDER_SITE_OTHER): Payer: 59 | Admitting: Family Medicine

## 2018-08-22 ENCOUNTER — Ambulatory Visit: Payer: 59 | Admitting: *Deleted

## 2018-08-22 VITALS — Ht 71.5 in | Wt 160.0 lb

## 2018-08-22 VITALS — BP 130/76 | HR 62 | Temp 98.0°F | Ht 71.0 in | Wt 164.4 lb

## 2018-08-22 DIAGNOSIS — N529 Male erectile dysfunction, unspecified: Secondary | ICD-10-CM

## 2018-08-22 DIAGNOSIS — R200 Anesthesia of skin: Secondary | ICD-10-CM | POA: Diagnosis not present

## 2018-08-22 DIAGNOSIS — M7989 Other specified soft tissue disorders: Secondary | ICD-10-CM | POA: Diagnosis not present

## 2018-08-22 DIAGNOSIS — Z86010 Personal history of colonic polyps: Secondary | ICD-10-CM

## 2018-08-22 DIAGNOSIS — M79641 Pain in right hand: Secondary | ICD-10-CM | POA: Diagnosis not present

## 2018-08-22 DIAGNOSIS — D72819 Decreased white blood cell count, unspecified: Secondary | ICD-10-CM | POA: Diagnosis not present

## 2018-08-22 MED ORDER — SILDENAFIL CITRATE 100 MG PO TABS: 50.0000 mg | ORAL_TABLET | Freq: Every day | ORAL | 2 refills | Status: DC | PRN

## 2018-08-22 MED ORDER — NA SULFATE-K SULFATE-MG SULF 17.5-3.13-1.6 GM/177ML PO SOLN: ORAL | 0 refills | Status: DC

## 2018-08-22 NOTE — Progress Notes (Signed)
Patient's pre-visit was done today over the phone with the patient due to COVID-19 pandemic. Name,DOB and address verified. Insurance verified. Packet of Prep instructions mailed to patient including copy of a consent form and pre-procedure patient acknowledgement form-pt is aware. Suprep $15 Coupon included. Patient understands to call us back with any questions or concerns. Patient denies any allergies to eggs or soy. Patient denies any problems with anesthesia/sedation. Patient denies any oxygen use at home. Patient denies taking any diet/weight loss medications or blood thinners. EMMI education assisgned to patient on colonoscopy, this was explained and instructions given to patient. Pt is aware that care partner will wait in the car during parking lot; if they feel like they will be too hot to wait in the car; they may wait in the lobby.  We want them to wear a mask (we do not have any that we can provide them), practice social distancing, and we will check their temperatures when they get here.  I did remind patient that their care partner needs to stay in the parking lot the entire time. Pt will wear mask into building Patient is aware to call us back if he needs further test to be done on his lungs.

## 2018-08-22 NOTE — Progress Notes (Signed)
Subjective:    Patient ID: Christian Marks, male    DOB: [birth year], 53 y.o.   MRN: [id number]  HPI Christian Marks is a 53 y.o. male Presents today for: No chief complaint on file.  Seen for annual physical exam few weeks ago.   Erectile dysfunction: Normal testosterone levels June 5. Sx's for years. Same relationship for years. Able to achieve erection - difficulty.  No chest pain with exertion/exercise. No history of heart disease.  No prior trial of Viagra, Levitra.  Right arm numbness: Discussed June 5.  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  Advised to avoid sleeping on arm as initial approach. Still comes and goes. Has tried to change sleep position, but wakes up on that side.  Numbness in am only.   Right hand pain Intermittent swelling and hand pain discussed last visit.  Uric acid, sed rate, rheumatoid factor were normal.  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  R hand pain has persisted - middle of that hand- pain there all the time. Worse with increased use.  Material handler - grasping.  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 Intermittent swelling - not currently.  Swells with playing video game. No prior ortho/hand eval.  R hand dominaant.  Some relief after advil with dental work few weeks ago, no daily meds.  XR hand planned July 10th.   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  Same level in January 2019. No fever, no unexplained wt loss, no night sweats. No recnet illness/infections.   WBC 3.1 in 02/2017, 4.2 in 05/2014, 5.8 in 01/2014.  No known FH of neutropenia. No new meds. MVI QD.  Lab Results  Component Value Date   WBC 2.8 (L) 08/01/2018   HGB 13.9 08/01/2018   HCT 41.1 08/01/2018   MCV 96 08/01/2018   PLT 191 08/01/2018      Patient Active Problem  List   Diagnosis Date Noted  . Lung nodules   . Community acquired pneumonia 02/14/2014   No past medical history on file. Past Surgical History:  Procedure Laterality Date  . COLONOSCOPY  04/29/2017  . STOMACH SURGERY  2007?   stomach ulcer sx   No Known Allergies Prior to Admission medications   Medication Sig Start Date End Date Taking? Authorizing Provider  Multiple Vitamins-Minerals (CENTRUM ADULTS PO) Take 1 tablet by mouth daily.    [provider]  Na Sulfate-K Sulfate-Mg Sulf 17.5-3.13-1.6 GM/177ML SOLN Suprep (no substitutions)-TAKE AS DIRECTED. 08/22/18   Armbruster, Carlota Raspberry, MD   Social History   Socioeconomic History  . Marital status: Legally Separated    Spouse name: Not on file  . Number of children: Not on file  . Years of education: Not on file  . Highest education level: Not on file  Occupational History  . Not on file  Social Needs  . Financial resource strain: Not on file  . Food insecurity    Worry: Not on file    Inability: Not on file  . Transportation needs    Medical: Not on file    Non-medical: Not on file  Tobacco Use  . Smoking status: Current Every Day Smoker    Packs/day: 1.00    Types: Cigarettes  . Smokeless tobacco: Never Used  Substance and Sexual Activity  . Alcohol use: Yes  Alcohol/week: 12.0 standard drinks    Types: 12 Cans of beer per week    Comment:  12 drinks per week per pt  . Drug use: Not Currently    Frequency: 2.0 times per week    Types: Marijuana    Comment: uses 2 times per week per pt- last use 04-27-17  . Sexual activity: Not on file  Lifestyle  . Physical activity    Days per week: Not on file    Minutes per session: Not on file  . Stress: Not on file  Relationships  . Social Herbalist on phone: Not on file    Gets together: Not on file    Attends religious service: Not on file    Active member of club or organization: Not on file    Attends meetings of clubs or organizations: Not  on file    Relationship status: Not on file  . Intimate partner violence    Fear of current or ex partner: Not on file    Emotionally abused: Not on file    Physically abused: Not on file    Forced sexual activity: Not on file  Other Topics Concern  . Not on file  Social History Narrative  . Not on file    Review of Systems  Per HPI.      Objective:   Physical Exam Vitals signs reviewed.  Constitutional:      Appearance: He is well-developed.  HENT:     Head: Normocephalic and atraumatic.  Eyes:     Pupils: Pupils are equal, round, and reactive to light.  Neck:     Vascular: No carotid bruit or JVD.  Cardiovascular:     Rate and Rhythm: Normal rate and regular rhythm.     Heart sounds: Normal heart sounds. No murmur.  Pulmonary:     Effort: Pulmonary effort is normal.     Breath sounds: Normal breath sounds. No rales.  Musculoskeletal:     Right wrist: He exhibits normal range of motion, no tenderness, no bony tenderness and no swelling.     Cervical back: He exhibits normal range of motion (Pain-free range of motion, does not reproduce right arm symptoms), no tenderness, no bony tenderness and no deformity.       Arms:     Right hand: He exhibits tenderness (No focal swelling, slight tenderness between MCP joints.  Pain with grip but full range of motion). He exhibits normal range of motion, no bony tenderness, no deformity, no laceration and no swelling. Normal sensation noted. Normal strength noted.  Skin:    General: Skin is warm and dry.  Neurological:     Mental Status: He is alert and oriented to person, place, and time.    Vitals:   08/22/18 1407 08/22/18 1445  BP: (!) 153/72 130/76  Pulse: 62   Temp: 98 F (36.7 C)   TempSrc: Oral   SpO2: 98%   Weight: 164 lb 6.4 oz (74.6 kg)   Height: 5\' 11"  (1.803 m)           Assessment & Plan:   Christian Marks is a 53 y.o. male Erectile dysfunction, unspecified erectile dysfunction type - Plan:  sildenafil (VIAGRA) 100 MG tablet  -viagra Rx given - use lowest effective dose. Side effects discussed (including but not limited to headache/flushing, blue discoloration of vision, possible vascular steal and risk of cardiac effects if underlying unknown coronary artery disease, and permanent sensorineural hearing loss). Understanding  expressed.  Leukopenia, unspecified type - Plan: Ambulatory referral to Hematology  -Decline past few years, stable from prior readings.  Asymptomatic.  Will refer to hematology to determine if further testing or evaluation.    Swelling of right hand - Plan: Ambulatory referral to Hand Surgery Right hand pain - Plan: Ambulatory referral to Hand Surgery Right arm numbness  -Suspected overuse syndrome, but also positive Tinel's testing with proximal radiation of pain to forearm.  Carpal tunnel also in differential.  Episodic NSAID temporarily and refer to hand surgeon for further evaluation.  Meds ordered this encounter  Medications  . sildenafil (VIAGRA) 100 MG tablet    Sig: Take 0.5-1 tablets (50-100 mg total) by mouth daily as needed for erectile dysfunction.    Dispense:  5 tablet    Refill:  2   Patient Instructions    Viagra if needed - lowest dose.   I will refer you to hand specialist.  Over the counter advil if needed short term.  I will also refer you to hematologist to look at your low blood count and decide if other testing needed.   Return to the clinic or go to the nearest emergency room if any of your symptoms worsen or new symptoms occur.   If you have lab work done today you will be contacted with your lab results within the next 2 weeks.  If you have not heard from [place] then please contact us. The fastest way to get your results is to register for My Chart.   IF you received an x-ray today, you will receive an invoice from [facility]. Please contact [facility] at [phone] with questions or concerns regarding your  invoice.   IF you received labwork today, you will receive an invoice from [facility]. Please contact [facility] at [phone] with questions or concerns regarding your invoice.   Our billing staff will not be able to assist you with questions regarding bills from these companies.  You will be contacted with the lab results as soon as they are available. The fastest way to get your results is to activate your My Chart account. Instructions are located on the last page of this paperwork. If you have not heard from [place] regarding the results in 2 weeks, please contact this office.       Signed,   Merri Ray, MD [facility].  08/22/18 2:49 PM

## 2018-08-22 NOTE — Patient Instructions (Addendum)
  Viagra if needed - lowest dose.   I will refer you to hand specialist.  Over the counter advil if needed short term.  I will also refer you to hematologist to look at your low blood count and decide if other testing needed.   Return to the clinic or go to the nearest emergency room if any of your symptoms worsen or new symptoms occur.   If you have lab work done today you will be contacted with your lab results within the next 2 weeks.  If you have not heard from [place] then please contact us. The fastest way to get your results is to register for My Chart.   IF you received an x-ray today, you will receive an invoice from [facility]. Please contact [facility] at [phone] with questions or concerns regarding your invoice.   IF you received labwork today, you will receive an invoice from [facility]. Please contact [facility] at [phone] with questions or concerns regarding your invoice.   Our billing staff will not be able to assist you with questions regarding bills from these companies.  You will be contacted with the lab results as soon as they are available. The fastest way to get your results is to activate your My Chart account. Instructions are located on the last page of this paperwork. If you have not heard from [place] regarding the results in 2 weeks, please contact this office.

## 2018-09-01 ENCOUNTER — Other Ambulatory Visit: Payer: Self-pay

## 2018-09-01 ENCOUNTER — Ambulatory Visit (INDEPENDENT_AMBULATORY_CARE_PROVIDER_SITE_OTHER): Payer: 59 | Admitting: Orthopaedic Surgery

## 2018-09-01 ENCOUNTER — Encounter: Payer: Self-pay | Admitting: Orthopaedic Surgery

## 2018-09-01 DIAGNOSIS — R202 Paresthesia of skin: Secondary | ICD-10-CM

## 2018-09-01 DIAGNOSIS — R200 Anesthesia of skin: Secondary | ICD-10-CM | POA: Diagnosis not present

## 2018-09-01 NOTE — Progress Notes (Signed)
Office Visit Note   Patient: Christian Marks           Date of Birth: Jul 27, 1965           MRN: [id number] Visit Date: 09/01/2018              Requested by: Wendie Agreste, MD [address] [place],  [place] [address] PCP: Wendie Agreste, MD   Assessment & Plan: Visit Diagnoses:  1. Numbness and tingling in right hand     Plan: Given his signs and symptoms of numbness and tingling it does wake him up at night in his right dominant extremity combined with repetitive work I would like to obtain EMG-nerve conduction studies to rule out right upper extremity nerve compression and carpal tunnel syndrome.  All question concerns were answered addressed.  Once he has those studies obtained he can get a follow-up appointment with me within the next week.  Follow-Up Instructions: after nerve studies  Orders:  No orders of the defined types were placed in this encounter.  No orders of the defined types were placed in this encounter.     Procedures: No procedures performed   Clinical Data: No additional findings.   Subjective: Chief Complaint  Patient presents with  . Right Hand - Pain  The patient is a very pleasant right-hand-dominant gentleman who comes in for evaluation treatment of right hand pain with numbness and tingling that is been worsening for about a year now.  It is worse at night.  He does work as a Geologist, engineering doing repetitive work daily.  He is not a diabetic.  He is a smoker.  He does report some slight hand weakness and swelling as well.  He denies any injury.  He says his left side does not bother him.  He denies any neck pain.  HPI  Review of Systems He currently denies any headache, chest pain, shortness of breath, fever, chills, nausea, vomiting  Objective: Vital Signs: There were no vitals taken for this visit.  Physical Exam He is alert and orient x3 and in no acute distress Ortho Exam Examination of both hands showed no muscle atrophy.   Both hands have palpable pulses radial and ulnar.  Both hands show good capillary refill in all digits.  He does have slightly weak grip strength on his dominant right side comparing the right and left side.  His Phalen's and Tinel's exams were equivocal but when I hold the median nerve on both sides down with significant compression he does get numb and tingling quite quickly on his right side.  He has a negative Tinel sign at the elbow and a negative Spurling sign at the neck. Specialty Comments:  No specialty comments available.  Imaging: No results found.   PMFS History: Patient Active Problem List   Diagnosis Date Noted  . Lung nodules   . Community acquired pneumonia 02/14/2014   History reviewed. No pertinent past medical history.  Family History  Problem Relation Age of Onset  . Diabetes Mother   . Diabetes Sister   . Stomach cancer Neg Hx   . Esophageal cancer Neg Hx   . Colon cancer Neg Hx   . Rectal cancer Neg Hx   . Colon polyps Neg Hx     Past Surgical History:  Procedure Laterality Date  . COLONOSCOPY  04/29/2017  . STOMACH SURGERY  2007?   stomach ulcer sx   Social History   Occupational History  . Not on  file  Tobacco Use  . Smoking status: Current Every Day Smoker    Packs/day: 1.00    Types: Cigarettes  . Smokeless tobacco: Never Used  Substance and Sexual Activity  . Alcohol use: Yes    Alcohol/week: 12.0 standard drinks    Types: 12 Cans of beer per week    Comment:  12 drinks per week per pt  . Drug use: Not Currently    Frequency: 2.0 times per week    Types: Marijuana    Comment: uses 2 times per week per pt- last use 04-27-17  . Sexual activity: Not on file

## 2018-09-05 ENCOUNTER — Encounter: Payer: 59 | Admitting: Gastroenterology

## 2018-09-05 ENCOUNTER — Inpatient Hospital Stay: Admission: RE | Admit: 2018-09-05 | Payer: 59 | Source: Ambulatory Visit

## 2018-09-25 ENCOUNTER — Telehealth: Payer: Self-pay | Admitting: Gastroenterology

## 2018-09-25 NOTE — Telephone Encounter (Signed)
Left message for patient regarding Covid-19 screening questions. °Covid-19 Screening Questions: °  °Do you now or have you had a fever in the last 14 days?  °  °Do you have any respiratory symptoms of shortness of breath or cough now or in the last 14 days?  °  °Do you have any family members or close contacts with diagnosed or suspected Covid-19 in the past 14 days?  °  °Have you been tested for Covid-19 and found to be positive?  °  ° °

## 2018-09-26 ENCOUNTER — Telehealth: Payer: Self-pay | Admitting: Gastroenterology

## 2018-09-26 ENCOUNTER — Encounter: Payer: 59 | Admitting: Gastroenterology

## 2018-09-26 NOTE — Telephone Encounter (Signed)
Patient no showed his colonoscopy procedure today. Christian Marks this warrants a no show charge if you can take care of this. Thanks

## 2018-11-21 ENCOUNTER — Ambulatory Visit: Payer: 59 | Admitting: Family Medicine

## 2018-11-24 ENCOUNTER — Encounter: Payer: Self-pay | Admitting: Family Medicine

## 2019-09-26 ENCOUNTER — Encounter (HOSPITAL_COMMUNITY): Payer: Self-pay

## 2019-09-26 ENCOUNTER — Other Ambulatory Visit: Payer: Self-pay

## 2019-09-26 ENCOUNTER — Ambulatory Visit (HOSPITAL_COMMUNITY)
Admission: EM | Admit: 2019-09-26 | Discharge: 2019-09-26 | Disposition: A | Discharge status: Home / Self Care | Payer: 59 | Attending: Physician Assistant | Admitting: Physician Assistant

## 2019-09-26 DIAGNOSIS — S0502XA Injury of conjunctiva and corneal abrasion without foreign body, left eye, initial encounter: Secondary | ICD-10-CM

## 2019-09-26 MED ORDER — TETRACAINE HCL 0.5 % OP SOLN
OPHTHALMIC | Status: AC
  Filled 2019-09-26: qty 4

## 2019-09-26 MED ORDER — FLUORESCEIN SODIUM 1 MG OP STRP
ORAL_STRIP | OPHTHALMIC | Status: AC
  Filled 2019-09-26: qty 1

## 2019-09-26 MED ORDER — POLYMYXIN B-TRIMETHOPRIM 10000-0.1 UNIT/ML-% OP SOLN: 1.0000 [drp] | OPHTHALMIC | 0 refills | Status: AC

## 2019-09-26 MED ORDER — KETOROLAC TROMETHAMINE 0.5 % OP SOLN: 1.0000 [drp] | Freq: Four times a day (QID) | OPHTHALMIC | 0 refills | Status: AC

## 2019-09-26 NOTE — ED Provider Notes (Signed)
[facility]    CSN: [id number] Arrival date & time: 09/26/19  1036      History   Chief Complaint Chief Complaint  Patient presents with  . Eye Problem    HPI Christian Marks is a 54 y.o. male.   Patient reports for cute onset of left eye pain with burning sensation.  Reports this started earlier today.  He was walking from the grocery store immediately noticed sensation of something in his left eye with some burning.    He reports it hurts to close the eye.  Denies pain with light.  He reports the discomfort is minimal with the eyes open.  Denies pain around the eye.  Denies any trauma to the eye.  He has never had issues with his eyes.  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  He was not around this today.  He has tried some eyedrops.  He tried to go to some eye doctor offices but they are closed.     History reviewed. No pertinent past medical history.  Patient Active Problem List   Diagnosis Date Noted  . Lung nodules   . Community acquired pneumonia 02/14/2014    Past Surgical History:  Procedure Laterality Date  . COLONOSCOPY  04/29/2017  . STOMACH SURGERY  2007?   stomach ulcer sx       Home Medications    Prior to Admission medications   Medication Sig Start Date End Date Taking? Authorizing Provider  ketorolac (ACULAR) 0.5 % ophthalmic solution Place 1 drop into the left eye every 6 (six) hours for 5 days. 09/26/19 10/01/19  Chava Dulac, Marguerita Beards, PA-C  Multiple Vitamins-Minerals (CENTRUM ADULTS PO) Take 1 tablet by mouth daily.    [provider]  sildenafil (VIAGRA) 100 MG tablet Take 0.5-1 tablets (50-100 mg total) by mouth daily as needed for erectile dysfunction. 08/22/18   Wendie Agreste, MD  trimethoprim-polymyxin b (POLYTRIM) ophthalmic solution Place 1 drop into the left eye every 4 (four) hours for 10 days. 09/26/19 10/06/19  Iridiana Fonner, Marguerita Beards, PA-C    Family History Family History  Problem Relation  Age of Onset  . Diabetes Mother   . Diabetes Sister   . Stomach cancer Neg Hx   . Esophageal cancer Neg Hx   . Colon cancer Neg Hx   . Rectal cancer Neg Hx   . Colon polyps Neg Hx     Social History Social History   Tobacco Use  . Smoking status: Current Every Day Smoker    Packs/day: 1.00    Types: Cigarettes  . Smokeless tobacco: Never Used  Vaping Use  . Vaping Use: Never used  Substance Use Topics  . Alcohol use: Yes    Alcohol/week: 12.0 standard drinks    Types: 12 Cans of beer per week    Comment:  12 drinks per week per pt  . Drug use: Not Currently    Frequency: 2.0 times per week    Types: Marijuana    Comment: uses 2 times per week per pt- last use 04-27-17     Allergies   Patient has no known allergies.   Review of Systems Review of Systems   Physical Exam Triage Vital Signs ED Triage Vitals [09/26/19 1141]  Enc Vitals Group     BP (!) 139/94     Pulse Rate 78     Resp 16     Temp 98.1 F (36.7 C)  Temp Source Oral     SpO2 100 %     Weight      Height      Head Circumference      Peak Flow      Pain Score      Pain Loc      Pain Edu?      Excl. in [place]?    No data found.  Updated Vital Signs BP (!) 139/94 (BP Location: Left Arm)   Pulse 78   Temp 98.1 F (36.7 C) (Oral)   Resp 16   SpO2 100%   Visual Acuity Right Eye Distance: 20/30 without corrective lens Left Eye Distance: 20/20 without corrective lens Bilateral Distance: 20/20 without corrective lens  Right Eye Near:   Left Eye Near:    Bilateral Near:     Physical Exam Vitals and nursing note reviewed.  Constitutional:      Appearance: Normal appearance.  Eyes:     General: Lids are normal. Lids are everted, no foreign bodies appreciated. Vision grossly intact. No allergic shiner or visual field deficit.       Right eye: No foreign body, discharge or hordeolum.        Left eye: No foreign body, discharge or hordeolum.     Extraocular Movements: Extraocular  movements intact.     Conjunctiva/sclera:     Right eye: Right conjunctiva is not injected. No chemosis, exudate or hemorrhage.    Left eye: Left conjunctiva is not injected. No chemosis, exudate or hemorrhage.     Comments: Fluorescein exam: There is a fine vertical scratch/abrasion of the left cornea marked on diagram  There is also a circular brown spot on the left lateral aspect of the left eye, patient states is been here for some time and is not new.  No uptake in this.  Patient reports significant improvement in pain with tetracaine application.  There is no tenderness of the globe of the eye.  No surrounding erythema or swelling.  No photophobia in either eye.  No pain with extraocular movements.  Neurological:     Mental Status: He is alert.      UC Treatments / Results  Labs (all labs ordered are listed, but only abnormal results are displayed) Labs Reviewed - No data to display  EKG   Radiology No results found.  Procedures Procedures (including critical care time)  Medications Ordered in UC Medications - No data to display  Initial Impression / Assessment and Plan / UC Course  I have reviewed the triage vital signs and the nursing notes.  Pertinent labs & imaging results that were available during my care of the patient were reviewed by me and considered in my medical decision making (see chart for details).     #Corneal abrasion left eye Patient is a 54 year old gentleman presenting with a small corneal abrasion.  Will start on Polytrim drops and give short course of Toradol drops for pain.  Discussed follow-up with ophthalmologist or his primary care on Monday or Tuesday, felt either would be appropriate given small abrasion.  Did discuss that regardless he should have reevaluation in 2 to 3 days.  Strict emergency department precautions were discussed.  He verbalized understanding plan of care. Final Clinical Impressions(s) / UC Diagnoses   Final  diagnoses:  Abrasion of left cornea, initial encounter     Discharge Instructions     You have a small scratch on your left eye  Use polytrim/antbiotic drops daily as prescribed  Use ketorolac/acular drops only as needed for pain Continue real tears drops or other over the counter drops  Follow up with the Eye doctor on Monday. You may also consider follow up with your primary care on Monday, however you should have re-evaluation in 2-3 days regardless  If severe pain, vision loss develop go to the Emergency department      ED Prescriptions    Medication Sig Dispense Auth. Provider   trimethoprim-polymyxin b (POLYTRIM) ophthalmic solution Place 1 drop into the left eye every 4 (four) hours for 10 days. 10 mL Laker Thompson, Marguerita Beards, PA-C   ketorolac (ACULAR) 0.5 % ophthalmic solution Place 1 drop into the left eye every 6 (six) hours for 5 days. 3 mL Jordi Lacko, Marguerita Beards, PA-C     PDMP not reviewed this encounter.   Purnell Shoemaker, PA-C 09/26/19 1302

## 2019-09-26 NOTE — ED Triage Notes (Signed)
Pt c/o acute left eye irritation, "feels like there's something in it" onset approx 3 hours ago. Denies known trauma/injury to eye. Pt keeps using hand to pull eye open "because it hurts if it closes". Both eyes with erythema to sclera.

## 2019-09-26 NOTE — Discharge Instructions (Signed)
You have a small scratch on your left eye  Use polytrim/antbiotic drops daily as prescribed Use ketorolac/acular drops only as needed for pain Continue real tears drops or other over the counter drops  Follow up with the Eye doctor on Monday. You may also consider follow up with your primary care on Monday, however you should have re-evaluation in 2-3 days regardless  If severe pain, vision loss develop go to the Emergency department

## 2019-10-28 ENCOUNTER — Telehealth: Payer: Self-pay | Admitting: Family Medicine

## 2019-10-28 DIAGNOSIS — N529 Male erectile dysfunction, unspecified: Secondary | ICD-10-CM

## 2019-10-28 DIAGNOSIS — Z131 Encounter for screening for diabetes mellitus: Secondary | ICD-10-CM

## 2019-10-28 DIAGNOSIS — Z125 Encounter for screening for malignant neoplasm of prostate: Secondary | ICD-10-CM

## 2019-10-28 DIAGNOSIS — Z13220 Encounter for screening for lipoid disorders: Secondary | ICD-10-CM

## 2019-10-28 NOTE — Telephone Encounter (Signed)
Pt needing orders in for cpe labs on 01/22/20. Please advise.

## 2019-10-29 NOTE — Telephone Encounter (Signed)
Labs have been ordered for pt's upcoming CPE

## 2020-01-22 ENCOUNTER — Ambulatory Visit: Payer: 59

## 2020-01-25 ENCOUNTER — Other Ambulatory Visit: Payer: Self-pay

## 2020-01-25 ENCOUNTER — Encounter: Payer: Self-pay | Admitting: Family Medicine

## 2020-01-25 ENCOUNTER — Ambulatory Visit (INDEPENDENT_AMBULATORY_CARE_PROVIDER_SITE_OTHER): Payer: 59 | Admitting: Family Medicine

## 2020-01-25 ENCOUNTER — Ambulatory Visit (INDEPENDENT_AMBULATORY_CARE_PROVIDER_SITE_OTHER): Payer: 59

## 2020-01-25 VITALS — BP 135/89 | HR 77 | Temp 97.8°F | Ht 71.0 in | Wt 169.0 lb

## 2020-01-25 DIAGNOSIS — Z23 Encounter for immunization: Secondary | ICD-10-CM | POA: Diagnosis not present

## 2020-01-25 DIAGNOSIS — Z13220 Encounter for screening for lipoid disorders: Secondary | ICD-10-CM

## 2020-01-25 DIAGNOSIS — R918 Other nonspecific abnormal finding of lung field: Secondary | ICD-10-CM

## 2020-01-25 DIAGNOSIS — Z0000 Encounter for general adult medical examination without abnormal findings: Secondary | ICD-10-CM

## 2020-01-25 DIAGNOSIS — N529 Male erectile dysfunction, unspecified: Secondary | ICD-10-CM

## 2020-01-25 DIAGNOSIS — Z1159 Encounter for screening for other viral diseases: Secondary | ICD-10-CM | POA: Diagnosis not present

## 2020-01-25 DIAGNOSIS — Z125 Encounter for screening for malignant neoplasm of prostate: Secondary | ICD-10-CM

## 2020-01-25 DIAGNOSIS — D709 Neutropenia, unspecified: Secondary | ICD-10-CM

## 2020-01-25 DIAGNOSIS — D126 Benign neoplasm of colon, unspecified: Secondary | ICD-10-CM

## 2020-01-25 DIAGNOSIS — F17210 Nicotine dependence, cigarettes, uncomplicated: Secondary | ICD-10-CM

## 2020-01-25 MED ORDER — CHANTIX STARTING MONTH PAK 0.5 MG X 11 & 1 MG X 42 PO TABS: ORAL_TABLET | ORAL | 0 refills | Status: AC

## 2020-01-25 MED ORDER — SILDENAFIL CITRATE 100 MG PO TABS: 50.0000 mg | ORAL_TABLET | Freq: Every day | ORAL | 2 refills | Status: AC | PRN

## 2020-01-25 MED ORDER — VARENICLINE TARTRATE 1 MG PO TABS: 1.0000 mg | ORAL_TABLET | Freq: Two times a day (BID) | ORAL | 2 refills | Status: AC

## 2020-01-25 NOTE — Progress Notes (Signed)
Subjective:  Patient ID: Christian Marks, male    DOB: [birth year]  Age: 54 y.o. MRN: [id number]  CC:  Chief Complaint  Patient presents with  . Annual Exam    Pt reports he feels fine with no complaints, but would like a refill on his Viagra prescription.    HPI Christian Marks presents for  Annual physical exam.  Erectile dysfunction Treated with sildenafil 100 mg 1/2-1 as needed. Out of meds, no side effects when taking med. took full pill.  No change in hearing/vision or CP with that med.  No CP with exertion.   Leukopenia WBC declined past few years when discussed in June 2020, referred to hematology.  Referrals notes reviewed.  Appears he was contacted for appointment but no callback.  Has not seen hematology. No unexplained wt loss, night sweats or fevers.  Lab Results  Component Value Date   WBC 3.5 01/25/2020   HGB 14.8 01/25/2020   HCT 42.5 01/25/2020   MCV 94 01/25/2020   PLT 240 01/25/2020   Cancer screening Colonoscopy 04/29/2017, 2 polyps removed, Dr. Havery Moros.  Both adenomatous polyps, and based on the size of the polyp recommended repeat colonoscopy in 6 months. Patient no showed his colonoscopy procedure on September 26, 2018. Unable to have ride that day.   The natural history of prostate cancer and ongoing controversy regarding screening and potential treatment outcomes of prostate cancer has been discussed with the patient. The meaning of a false positive PSA and a false negative PSA has been discussed. He indicates understanding of the limitations of this screening test and wishes to proceed with screening PSA testing only. DRE declined.  Lab Results  Component Value Date   PSA1 0.6 01/25/2020   PSA1 0.7 03/06/2017   Immunization History  Administered Date(s) Administered  . Influenza,inj,Quad PF,6+ Mos 02/15/2014  . Tdap 01/25/2020  declines covid vaccine, denies questions.  Declines flu vaccine.   Depression screen [facility] 2/9 01/25/2020 08/22/2018  08/01/2018 04/15/2018 03/22/2017  Decreased Interest 0 0 0 0 0  Down, Depressed, Hopeless 0 0 0 0 0  PHQ - 2 Score 0 0 0 0 0  Altered sleeping - - - 0 -  Tired, decreased energy - - - 0 -  Change in appetite - - - 0 -  Feeling bad or failure about yourself  - - - 0 -  Trouble concentrating - - - 0 -  Moving slowly or fidgety/restless - - - 0 -  Suicidal thoughts - - - 0 -  PHQ-9 Score - - - 0 -  Difficult doing work/chores - - - Not difficult at all -    Hearing Screening   125Hz  250Hz  500Hz  1000Hz  2000Hz  3000Hz  4000Hz  6000Hz  8000Hz   Right ear:           Left ear:             Visual Acuity Screening   Right eye Left eye Both eyes  Without correction: 20/30-2 20/30 20/20-1  With correction:      Dental: due - plans to schedule. About 6 months ago.   Exercise: physical work - Financial controller.   Alcohol/tobacco: Beer - 2 per day, no liquor. 14 per week, some days none. DUI at 54yo, none recent. Denies addiction.   Nicotine addiction:  1ppd for 36 years. Patches did not work. Has called 1800quitnow.  Ready to quit, would like to try chantix.   CT chest nodule follow up ordered in 2020, never had -  he notes that insurance wanted it done elsewhere - never had.on different insurnce now.   No cough/hemoptysis. No wt loss/night sweats.  Treated for pneumonia in 2015: CT chest 01/2014: IMPRESSION: 1. No evidence of pulmonary embolus. 2. There are 2 areas of nodular airspace disease in the superior segment of the right lower lobe measuring 2.5 x 2.8 cm and 2.5 x 2.3 cm respectively. There is a 1.5 x 1.5 cm nodular area of airspace disease in the left lower lobe. The overall appearance is concerning for an infectious etiology such as multilobar pneumonia versus septic emboli versus fungal infection. Malignancy is considered much less likely given that radiographically this has appeared to have rapidly progressed compared with 02/06/2014. Pulmonary Medicine consultation  recommended.     History Patient Active Problem List   Diagnosis Date Noted  . Lung nodules   . Community acquired pneumonia 02/14/2014   History reviewed. No pertinent past medical history. Past Surgical History:  Procedure Laterality Date  . COLONOSCOPY  04/29/2017  . STOMACH SURGERY  2007?   stomach ulcer sx   No Known Allergies Prior to Admission medications   Medication Sig Start Date End Date Taking? Authorizing Provider  Multiple Vitamins-Minerals (CENTRUM ADULTS PO) Take 1 tablet by mouth daily.   Yes [provider]  sildenafil (VIAGRA) 100 MG tablet Take 0.5-1 tablets (50-100 mg total) by mouth daily as needed for erectile dysfunction. 08/22/18  Yes Wendie Agreste, MD   Social History   Socioeconomic History  . Marital status: Legally Separated    Spouse name: Not on file  . Number of children: Not on file  . Years of education: Not on file  . Highest education level: Not on file  Occupational History  . Not on file  Tobacco Use  . Smoking status: Current Every Day Smoker    Packs/day: 1.00    Types: Cigarettes  . Smokeless tobacco: Never Used  Vaping Use  . Vaping Use: Never used  Substance and Sexual Activity  . Alcohol use: Yes    Alcohol/week: 7.0 standard drinks    Types: 7 Cans of beer per week    Comment: 7 drinks per week per pt  . Drug use: Not Currently    Frequency: 2.0 times per week    Types: Marijuana    Comment: uses 2 times per week per pt- last use 04-27-17  . Sexual activity: Yes  Other Topics Concern  . Not on file  Social History Narrative  . Not on file   Social Determinants of Health   Financial Resource Strain:   . Difficulty of Paying Living Expenses: Not on file  Food Insecurity:   . Worried About Charity fundraiser in the Last Year: Not on file  . Ran Out of Food in the Last Year: Not on file  Transportation Needs:   . Lack of Transportation (Medical): Not on file  . Lack of Transportation (Non-Medical):  Not on file  Physical Activity:   . Days of Exercise per Week: Not on file  . Minutes of Exercise per Session: Not on file  Stress:   . Feeling of Stress : Not on file  Social Connections:   . Frequency of Communication with Friends and Family: Not on file  . Frequency of Social Gatherings with Friends and Family: Not on file  . Attends Religious Services: Not on file  . Active Member of Clubs or Organizations: Not on file  . Attends Archivist Meetings:  Not on file  . Marital Status: Not on file  Intimate Partner Violence:   . Fear of Current or Ex-Partner: Not on file  . Emotionally Abused: Not on file  . Physically Abused: Not on file  . Sexually Abused: Not on file    Review of Systems  Constitutional: Negative for diaphoresis, fatigue, fever and unexpected weight change.  Respiratory: Negative for cough and shortness of breath.   Cardiovascular: Negative for chest pain.     Objective:   Vitals:   01/25/20 1510  BP: 135/89  Pulse: 77  Temp: 97.8 F (36.6 C)  TempSrc: Temporal  SpO2: 99%  Weight: 169 lb (76.7 kg)  Height: 5\' 11"  (1.803 m)     Physical Exam Vitals reviewed.  Constitutional:      Appearance: He is well-developed.  HENT:     Head: Normocephalic and atraumatic.  Eyes:     Pupils: Pupils are equal, round, and reactive to light.  Neck:     Vascular: No carotid bruit or JVD.  Cardiovascular:     Rate and Rhythm: Normal rate and regular rhythm.     Heart sounds: Normal heart sounds. No murmur heard.   Pulmonary:     Effort: Pulmonary effort is normal.     Breath sounds: Normal breath sounds. No rales.  Skin:    General: Skin is warm and dry.  Neurological:     Mental Status: He is alert and oriented to person, place, and time.        Assessment & Plan:  Van Seymore is a 54 y.o. male . Annual physical exam  - -anticipatory guidance as below in AVS, screening labs above. Health maintenance items as above in HPI  discussed/recommended as applicable.   Erectile dysfunction, unspecified erectile dysfunction type - Plan: sildenafil (VIAGRA) 100 MG tablet  - viagra Rx given - use lowest effective dose. Side effects discussed (including but not limited to headache/flushing, blue discoloration of vision, possible vascular steal and risk of cardiac effects if underlying unknown coronary artery disease, and permanent sensorineural hearing loss). Understanding expressed.  Encounter for hepatitis C screening test for low risk patient - Plan: Hepatitis C antibody  Need for vaccination - Plan: Tdap vaccine greater than or equal to 7yo IM  Cigarette nicotine dependence without complication - Plan: varenicline (CHANTIX CONTINUING MONTH PAK) 1 MG tablet, varenicline (CHANTIX STARTING MONTH PAK) 0.5 MG X 11 & 1 MG X 42 tablet  -Cessation discussed.  Would like to try Chantix, potential side effects and risks were discussed, handout given on steps for quitting smoking.  Abnormal finding on lung imaging - Plan: DG Chest 2 View  -Check updated chest x-ray with prior nodular opacities, suspected infectious cause prior, and asymptomatic at this time. consider yearly low-dose CT screening with history of tobacco abuse.  Screening for prostate cancer - Plan: PSA  - We discussed pros and cons of prostate cancer screening, and after this discussion, he chose to have screening done. PSA obtained only at his request.  Limitations of screening discussed.    Screening for hyperlipidemia - Plan: Comprehensive metabolic panel, Lipid panel  Neutropenia, unspecified type ([place]) - Plan: CBC  -Asymptomatic, repeat CBC, consider repeat hematology referral if persistent low readings.  Adenoma of colon  -Importance of follow-up with gastroenterology discussed based on previous colonoscopy results.  He plans to call and schedule appointment.  Does feel like he would be able to obtain a ride for procedure at this time.  Meds ordered  this  encounter  Medications  . sildenafil (VIAGRA) 100 MG tablet    Sig: Take 0.5-1 tablets (50-100 mg total) by mouth daily as needed for erectile dysfunction.    Dispense:  5 tablet    Refill:  2  . varenicline (CHANTIX CONTINUING MONTH PAK) 1 MG tablet    Sig: Take 1 tablet (1 mg total) by mouth 2 (two) times daily.    Dispense:  60 tablet    Refill:  2  . varenicline (CHANTIX STARTING MONTH PAK) 0.5 MG X 11 & 1 MG X 42 tablet    Sig: Take one 0.5 mg tablet by mouth once daily for 3 days, then increase to one 0.5 mg tablet twice daily for 4 days, then increase to one 1 mg tablet twice daily.    Dispense:  53 tablet    Refill:  0   Patient Instructions    Call Dr. Doyne Keel office to reschedule colonoscopy.  Try to plan ahead to have a family member or friend for transportation on that day.   I will repeat blood count today. If still low I can refer you to hematology again.   chantix for quitting smoking.   Depending on xray results, we can look into CT scan of chest.   Try lowest effective dose of viagra.   Return to the clinic or go to the nearest emergency room if any of your symptoms worsen or new symptoms occur.    Steps to Quit Smoking Smoking tobacco is the leading cause of preventable death. It can affect almost every organ in the body. Smoking puts you and those around you at risk for developing many serious chronic diseases. Quitting smoking can be difficult, but it is one of the best things that you can do for your health. It is never too late to quit. How do I get ready to quit? When you decide to quit smoking, create a plan to help you succeed. Before you quit:  Pick a date to quit. Set a date within the next 2 weeks to give you time to prepare.  Write down the reasons why you are quitting. Keep this list in places where you will see it often.  Tell your family, friends, and co-workers that you are quitting. Support from your loved ones can make quitting  easier.  Talk with your health care provider about your options for quitting smoking.  Find out what treatment options are covered by your health insurance.  Identify people, places, things, and activities that make you want to smoke (triggers). Avoid them. What first steps can I take to quit smoking?  Throw away all cigarettes at home, at work, and in your car.  Throw away smoking accessories, such as Scientist, research (medical).  Clean your car. Make sure to empty the ashtray.  Clean your home, including curtains and carpets. What strategies can I use to quit smoking? Talk with your health care provider about combining strategies, such as taking medicines while you are also receiving in-person counseling. Using these two strategies together makes you more likely to succeed in quitting than if you used either strategy on its own.  If you are pregnant or breastfeeding, talk with your health care provider about finding counseling or other support strategies to quit smoking. Do not take medicine to help you quit smoking unless your health care provider tells you to do so. To quit smoking: Quit right away  Quit smoking completely, instead of gradually reducing how much you  smoke over a period of time. Research shows that stopping smoking right away is more successful than gradually quitting.  Attend in-person counseling to help you build problem-solving skills. You are more likely to succeed in quitting if you attend counseling sessions regularly. Even short sessions of 10 minutes can be effective. Take medicine You may take medicines to help you quit smoking. Some medicines require a prescription and some you can purchase over-the-counter. Medicines may have nicotine in them to replace the nicotine in cigarettes. Medicines may:  Help to stop cravings.  Help to relieve withdrawal symptoms. Your health care provider may recommend:  Nicotine patches, gum, or lozenges.  Nicotine inhalers or  sprays.  Non-nicotine medicine that is taken by mouth. Find resources Find resources and support systems that can help you to quit smoking and remain smoke-free after you quit. These resources are most helpful when you use them often. They include:  Online chats with a Social worker.  Telephone quitlines.  Printed Furniture conservator/restorer.  Support groups or group counseling.  Text messaging programs.  Mobile phone apps or applications. Use apps that can help you stick to your quit plan by providing reminders, tips, and encouragement. There are many free apps for mobile devices as well as websites. Examples include Quit Guide from the [employer] and smokefree.gov What things can I do to make it easier to quit?   Reach out to your family and friends for support and encouragement. Call telephone quitlines (1-800-QUIT-NOW), reach out to support groups, or work with a counselor for support.  Ask people who smoke to avoid smoking around you.  Avoid places that trigger you to smoke, such as bars, parties, or smoke-break areas at work.  Spend time with people who do not smoke.  Lessen the stress in your life. Stress can be a smoking trigger for some people. To lessen stress, try: ? Exercising regularly. ? Doing deep-breathing exercises. ? Doing yoga. ? Meditating. ? Performing a body scan. This involves closing your eyes, scanning your body from head to toe, and noticing which parts of your body are particularly tense. Try to relax the muscles in those areas. How will I feel when I quit smoking? Day 1 to 3 weeks Within the first 24 hours of quitting smoking, you may start to feel withdrawal symptoms. These symptoms are usually most noticeable 2-3 days after quitting, but they usually do not last for more than 2-3 weeks. You may experience these symptoms:  Mood swings.  Restlessness, anxiety, or irritability.  Trouble concentrating.  Dizziness.  Strong cravings for sugary foods and  nicotine.  Mild weight gain.  Constipation.  Nausea.  Coughing or a sore throat.  Changes in how the medicines that you take for unrelated issues work in your body.  Depression.  Trouble sleeping (insomnia). Week 3 and afterward After the first 2-3 weeks of quitting, you may start to notice more positive results, such as:  Improved sense of smell and taste.  Decreased coughing and sore throat.  Slower heart rate.  Lower blood pressure.  Clearer skin.  The ability to breathe more easily.  Fewer sick days. Quitting smoking can be very challenging. Do not get discouraged if you are not successful the first time. Some people need to make many attempts to quit before they achieve long-term success. Do your best to stick to your quit plan, and talk with your health care provider if you have any questions or concerns. Summary  Smoking tobacco is the leading cause of preventable  death. Quitting smoking is one of the best things that you can do for your health.  When you decide to quit smoking, create a plan to help you succeed.  Quit smoking right away, not slowly over a period of time.  When you start quitting, seek help from your health care provider, family, or friends. This information is not intended to replace advice given to you by your health care provider. Make sure you discuss any questions you have with your health care provider. Document Revised: 11/07/2018 Document Reviewed: 05/03/2018 Elsevier Patient Education  [employer].    If you have lab work done today you will be contacted with your lab results within the next 2 weeks.  If you have not heard from [place] then please contact us. The fastest way to get your results is to register for My Chart.   IF you received an x-ray today, you will receive an invoice from [facility]. Please contact [facility] at [phone] with questions or concerns regarding your invoice.   IF you received  labwork today, you will receive an invoice from [facility]. Please contact [facility] at [phone] with questions or concerns regarding your invoice.   Our billing staff will not be able to assist you with questions regarding bills from these companies.  You will be contacted with the lab results as soon as they are available. The fastest way to get your results is to activate your My Chart account. Instructions are located on the last page of this paperwork. If you have not heard from [place] regarding the results in 2 weeks, please contact this office.         Signed, Merri Ray, MD [facility]

## 2020-01-25 NOTE — Patient Instructions (Addendum)
Call Dr. Doyne Keel office to reschedule colonoscopy.  Try to plan ahead to have a family member or friend for transportation on that day.   I will repeat blood count today. If still low I can refer you to hematology again.   chantix for quitting smoking.   Depending on xray results, we can look into CT scan of chest.   Try lowest effective dose of viagra.   Return to the clinic or go to the nearest emergency room if any of your symptoms worsen or new symptoms occur.    Steps to Quit Smoking Smoking tobacco is the leading cause of preventable death. It can affect almost every organ in the body. Smoking puts you and those around you at risk for developing many serious chronic diseases. Quitting smoking can be difficult, but it is one of the best things that you can do for your health. It is never too late to quit. How do I get ready to quit? When you decide to quit smoking, create a plan to help you succeed. Before you quit:  Pick a date to quit. Set a date within the next 2 weeks to give you time to prepare.  Write down the reasons why you are quitting. Keep this list in places where you will see it often.  Tell your family, friends, and co-workers that you are quitting. Support from your loved ones can make quitting easier.  Talk with your health care provider about your options for quitting smoking.  Find out what treatment options are covered by your health insurance.  Identify people, places, things, and activities that make you want to smoke (triggers). Avoid them. What first steps can I take to quit smoking?  Throw away all cigarettes at home, at work, and in your car.  Throw away smoking accessories, such as Scientist, research (medical).  Clean your car. Make sure to empty the ashtray.  Clean your home, including curtains and carpets. What strategies can I use to quit smoking? Talk with your health care provider about combining strategies, such as taking medicines while  you are also receiving in-person counseling. Using these two strategies together makes you more likely to succeed in quitting than if you used either strategy on its own.  If you are pregnant or breastfeeding, talk with your health care provider about finding counseling or other support strategies to quit smoking. Do not take medicine to help you quit smoking unless your health care provider tells you to do so. To quit smoking: Quit right away  Quit smoking completely, instead of gradually reducing how much you smoke over a period of time. Research shows that stopping smoking right away is more successful than gradually quitting.  Attend in-person counseling to help you build problem-solving skills. You are more likely to succeed in quitting if you attend counseling sessions regularly. Even short sessions of 10 minutes can be effective. Take medicine You may take medicines to help you quit smoking. Some medicines require a prescription and some you can purchase over-the-counter. Medicines may have nicotine in them to replace the nicotine in cigarettes. Medicines may:  Help to stop cravings.  Help to relieve withdrawal symptoms. Your health care provider may recommend:  Nicotine patches, gum, or lozenges.  Nicotine inhalers or sprays.  Non-nicotine medicine that is taken by mouth. Find resources Find resources and support systems that can help you to quit smoking and remain smoke-free after you quit. These resources are most helpful when you use them often. They include:  Online chats with a Social worker.  Telephone quitlines.  Printed Furniture conservator/restorer.  Support groups or group counseling.  Text messaging programs.  Mobile phone apps or applications. Use apps that can help you stick to your quit plan by providing reminders, tips, and encouragement. There are many free apps for mobile devices as well as websites. Examples include Quit Guide from the [employer] and smokefree.gov What things  can I do to make it easier to quit?   Reach out to your family and friends for support and encouragement. Call telephone quitlines (1-800-QUIT-NOW), reach out to support groups, or work with a counselor for support.  Ask people who smoke to avoid smoking around you.  Avoid places that trigger you to smoke, such as bars, parties, or smoke-break areas at work.  Spend time with people who do not smoke.  Lessen the stress in your life. Stress can be a smoking trigger for some people. To lessen stress, try: ? Exercising regularly. ? Doing deep-breathing exercises. ? Doing yoga. ? Meditating. ? Performing a body scan. This involves closing your eyes, scanning your body from head to toe, and noticing which parts of your body are particularly tense. Try to relax the muscles in those areas. How will I feel when I quit smoking? Day 1 to 3 weeks Within the first 24 hours of quitting smoking, you may start to feel withdrawal symptoms. These symptoms are usually most noticeable 2-3 days after quitting, but they usually do not last for more than 2-3 weeks. You may experience these symptoms:  Mood swings.  Restlessness, anxiety, or irritability.  Trouble concentrating.  Dizziness.  Strong cravings for sugary foods and nicotine.  Mild weight gain.  Constipation.  Nausea.  Coughing or a sore throat.  Changes in how the medicines that you take for unrelated issues work in your body.  Depression.  Trouble sleeping (insomnia). Week 3 and afterward After the first 2-3 weeks of quitting, you may start to notice more positive results, such as:  Improved sense of smell and taste.  Decreased coughing and sore throat.  Slower heart rate.  Lower blood pressure.  Clearer skin.  The ability to breathe more easily.  Fewer sick days. Quitting smoking can be very challenging. Do not get discouraged if you are not successful the first time. Some people need to make many attempts to quit  before they achieve long-term success. Do your best to stick to your quit plan, and talk with your health care provider if you have any questions or concerns. Summary  Smoking tobacco is the leading cause of preventable death. Quitting smoking is one of the best things that you can do for your health.  When you decide to quit smoking, create a plan to help you succeed.  Quit smoking right away, not slowly over a period of time.  When you start quitting, seek help from your health care provider, family, or friends. This information is not intended to replace advice given to you by your health care provider. Make sure you discuss any questions you have with your health care provider. Document Revised: 11/07/2018 Document Reviewed: 05/03/2018 Elsevier Patient Education  [employer].    If you have lab work done today you will be contacted with your lab results within the next 2 weeks.  If you have not heard from [place] then please contact us. The fastest way to get your results is to register for My Chart.   IF you received an x-ray today, you will  receive an Pharmacologist from [facility]. Please contact [facility] at [phone] with questions or concerns regarding your invoice.   IF you received labwork today, you will receive an invoice from [place]. Please contact [facility] at [phone] with questions or concerns regarding your invoice.   Our billing staff will not be able to assist you with questions regarding bills from these companies.  You will be contacted with the lab results as soon as they are available. The fastest way to get your results is to activate your My Chart account. Instructions are located on the last page of this paperwork. If you have not heard from [place] regarding the results in 2 weeks, please contact this office.

## 2020-01-26 ENCOUNTER — Encounter: Payer: Self-pay | Admitting: Radiology

## 2020-01-26 LAB — COMPREHENSIVE METABOLIC PANEL
ALT: 30 IU/L (ref 0–44)
AST: 21 IU/L (ref 0–40)
Albumin/Globulin Ratio: 1.8 (ref 1.2–2.2)
Albumin: 4.8 g/dL (ref 3.8–4.9)
Alkaline Phosphatase: 94 IU/L (ref 44–121)
BUN/Creatinine Ratio: 9 (ref 9–20)
BUN: 9 mg/dL (ref 6–24)
Bilirubin Total: 0.4 mg/dL (ref 0.0–1.2)
CO2: 23 mmol/L (ref 20–29)
Calcium: 9.7 mg/dL (ref 8.7–10.2)
Chloride: 103 mmol/L (ref 96–106)
Creatinine, Ser: 0.97 mg/dL (ref 0.76–1.27)
GFR calc Af Amer: 102 mL/min/{1.73_m2} (ref >59)
GFR calc non Af Amer: 88 mL/min/{1.73_m2} (ref >59)
Globulin, Total: 2.6 g/dL (ref 1.5–4.5)
Glucose: 80 mg/dL (ref 65–99)
Potassium: 4.1 mmol/L (ref 3.5–5.2)
Sodium: 139 mmol/L (ref 134–144)
Total Protein: 7.4 g/dL (ref 6.0–8.5)

## 2020-01-26 LAB — HEPATITIS C ANTIBODY: Hep C Virus Ab: <0.1 s/co ratio (ref 0.0–0.9)

## 2020-01-26 LAB — CBC
Hematocrit: 42.5 % (ref 37.5–51.0)
Hemoglobin: 14.8 g/dL (ref 13.0–17.7)
MCH: 32.8 pg (ref 26.6–33.0)
MCHC: 34.8 g/dL (ref 31.5–35.7)
MCV: 94 fL (ref 79–97)
Platelets: 240 10*3/uL (ref 150–450)
RBC: 4.51 x10E6/uL (ref 4.14–5.80)
RDW: 12.9 % (ref 11.6–15.4)
WBC: 3.5 10*3/uL (ref 3.4–10.8)

## 2020-01-26 LAB — LIPID PANEL
Chol/HDL Ratio: 3.5 ratio (ref 0.0–5.0)
Cholesterol, Total: 214 mg/dL — ABNORMAL HIGH (ref 100–199)
HDL: 62 mg/dL (ref >39)
LDL Chol Calc (NIH): 108 mg/dL — ABNORMAL HIGH (ref 0–99)
Triglycerides: 260 mg/dL — ABNORMAL HIGH (ref 0–149)
VLDL Cholesterol Cal: 44 mg/dL — ABNORMAL HIGH (ref 5–40)

## 2020-01-26 LAB — PSA: Prostate Specific Ag, Serum: 0.6 ng/mL (ref 0.0–4.0)

## 2020-01-27 ENCOUNTER — Telehealth: Payer: Self-pay | Admitting: Family Medicine

## 2020-01-27 NOTE — Telephone Encounter (Signed)
Patient returned our call for x-ray results. Difficult to reach him during work hours (poor reception). Please advise at [phone].

## 2020-01-28 NOTE — Telephone Encounter (Signed)
Spoke with pt about xray results. He will pick up chantix when available at pharmacy.

## 2020-02-01 NOTE — Progress Notes (Signed)
Letter sent.

## 2021-03-01 ENCOUNTER — Other Ambulatory Visit: Payer: Self-pay

## 2021-03-01 ENCOUNTER — Ambulatory Visit (HOSPITAL_COMMUNITY)
Admission: EM | Admit: 2021-03-01 | Discharge: 2021-03-01 | Disposition: A | Discharge status: Home / Self Care | Payer: 59 | Attending: Family Medicine | Admitting: Family Medicine

## 2021-03-01 DIAGNOSIS — S61011A Laceration without foreign body of right thumb without damage to nail, initial encounter: Secondary | ICD-10-CM | POA: Diagnosis not present

## 2021-03-01 MED ORDER — LIDOCAINE HCL (PF) 1 % IJ SOLN
INTRAMUSCULAR | Status: AC
  Filled 2021-03-01: qty 30

## 2021-03-01 NOTE — ED Triage Notes (Signed)
Pt reports laceration to the R thumb. States he cut himself with a knife around 5 pm.

## 2021-03-01 NOTE — ED Notes (Signed)
Dr Mannie Stabile evaluated pt.  Per Provider ok to see at [facility].   Finger wrapped and pressure applied.

## 2021-03-02 NOTE — ED Provider Notes (Signed)
°  [place]   [id number] 03/01/21 Arrival Time: 1700  ASSESSMENT & PLAN:  1. Laceration of right thumb without foreign body without damage to nail, initial encounter     Procedure: Verbal consent obtained. Patient provided with risks and alternatives to the procedure. Wound copiously irrigated with NS then cleansed with betadine. Digital block performed with 1% plain lidocaine. Wound carefully explored. No foreign body, tendon injury, or nonviable tissue were noted. Using sterile technique, 3 interrupted 4-0 Ethilon sutures were placed to reapproximate the wound. Procedure tolerated well. No complications. Minimal bleeding. Advised to look for and return for any signs of infection such as redness, swelling, discharge, or worsening pain. Return for suture removal in 7 days.   Reviewed expectations re: course of current medical issues. Questions answered. Outlined signs and symptoms indicating need for more acute intervention. Patient verbalized understanding. After Visit Summary given.   SUBJECTIVE:  Christian Marks is a 56 y.o. male who presents with a laceration of his distal R thumb; today; on kitchen knife. Mod bleeding; controlled. No extremity sensation changes or weakness.   Td UTD: Yes.  ROS: As per HPI. Health Maintenance Due  Topic Date Due   COVID-19 Vaccine (1) Never done   Pneumococcal Vaccine 71-54 Years old (1 - PCV) Never done   Zoster Vaccines- Shingrix (1 of 2) Never done   COLONOSCOPY (Pts 45-25yrs Insurance coverage will need to be confirmed)  04/30/2018   INFLUENZA VACCINE  09/26/2020    OBJECTIVE: General appearance: alert; no distress Skin: linear laceration of R distal thumb without nail involvement; size: approx 1 cm; clean wound edges, no foreign bodies; with active bleeding Psychological: alert and cooperative; normal mood and affect  Allergies  Allergen Reactions   Peanut-Containing Drug Products     No past medical history on  file. Social History   Socioeconomic History   Marital status: Legally Separated    Spouse name: Not on file   Number of children: Not on file   Years of education: Not on file   Highest education level: Not on file  Occupational History   Not on file  Tobacco Use   Smoking status: Every Day    Packs/day: 1.00    Types: Cigarettes   Smokeless tobacco: Never  Vaping Use   Vaping Use: Never used  Substance and Sexual Activity   Alcohol use: Yes    Alcohol/week: 7.0 standard drinks    Types: 7 Cans of beer per week    Comment: 7 drinks per week per pt   Drug use: Not Currently    Frequency: 2.0 times per week    Types: Marijuana    Comment: uses 2 times per week per pt- last use 04-27-17   Sexual activity: Yes  Other Topics Concern   Not on file  Social History Narrative   Not on file   Social Determinants of Health   Financial Resource Strain: Not on file  Food Insecurity: Not on file  Transportation Needs: Not on file  Physical Activity: Not on file  Stress: Not on file  Social Connections: Not on file          Vanessa Kick, MD 03/02/21 [phone]

## 2021-03-08 ENCOUNTER — Ambulatory Visit (HOSPITAL_COMMUNITY)
Admission: EM | Admit: 2021-03-08 | Discharge: 2021-03-08 | Disposition: A | Discharge status: Home / Self Care | Payer: 59 | Attending: Family Medicine | Admitting: Family Medicine

## 2021-03-08 ENCOUNTER — Encounter (HOSPITAL_COMMUNITY): Payer: Self-pay | Admitting: Emergency Medicine

## 2021-03-08 ENCOUNTER — Other Ambulatory Visit: Payer: Self-pay

## 2021-03-08 DIAGNOSIS — Z760 Encounter for issue of repeat prescription: Secondary | ICD-10-CM

## 2021-03-08 DIAGNOSIS — Z4802 Encounter for removal of sutures: Secondary | ICD-10-CM

## 2021-03-08 NOTE — ED Provider Notes (Signed)
[place]    CSN: [id number] Arrival date & time: 03/08/21  1804    HISTORY   Chief Complaint  Patient presents with   Medication Refill   Suture / Staple Removal   HPI Christian Marks is a 56 y.o. male. Pt reports here to get sutures removed from right thumb. Denies any problems or s/s of infection.  Pt reports that since [place] is closed he is needing medications refilled of Sildenafil 100mg  and Varenicline 1mg .  EMR reviewed, neither of these medications has been refilled since November 2021.  The history is provided by the patient.  Suture / Staple Removal  History reviewed. No pertinent past medical history. Patient Active Problem List   Diagnosis Date Noted   Lung nodules    Community acquired pneumonia 02/14/2014   Past Surgical History:  Procedure Laterality Date   COLONOSCOPY  04/29/2017   STOMACH SURGERY  2007?   stomach ulcer sx    Home Medications    Prior to Admission medications   Medication Sig Start Date End Date Taking? Authorizing Provider  sildenafil (VIAGRA) 100 MG tablet Take 0.5-1 tablets (50-100 mg total) by mouth daily as needed for erectile dysfunction. 01/25/20   Wendie Agreste, MD  varenicline (CHANTIX CONTINUING MONTH PAK) 1 MG tablet Take 1 tablet (1 mg total) by mouth 2 (two) times daily. 01/25/20   Wendie Agreste, MD  varenicline (CHANTIX STARTING MONTH PAK) 0.5 MG X 11 & 1 MG X 42 tablet Take one 0.5 mg tablet by mouth once daily for 3 days, then increase to one 0.5 mg tablet twice daily for 4 days, then increase to one 1 mg tablet twice daily. 01/25/20   Wendie Agreste, MD    Family History Family History  Problem Relation Age of Onset   Diabetes Mother    Diabetes Sister    Stomach cancer Neg Hx    Esophageal cancer Neg Hx    Colon cancer Neg Hx    Rectal cancer Neg Hx    Colon polyps Neg Hx    Social History Social History   Tobacco Use   Smoking status: Every Day    Packs/day: 1.00    Types:  Cigarettes   Smokeless tobacco: Never  Vaping Use   Vaping Use: Never used  Substance Use Topics   Alcohol use: Yes    Alcohol/week: 7.0 standard drinks    Types: 7 Cans of beer per week    Comment: 7 drinks per week per pt   Drug use: Not Currently    Frequency: 2.0 times per week    Types: Marijuana    Comment: uses 2 times per week per pt- last use 04-27-17   Allergies   Peanut-containing drug products  Review of Systems Review of Systems Pertinent findings noted in history of present illness.   Physical Exam Triage Vital Signs ED Triage Vitals  Enc Vitals Group     BP 12/23/20 0827 (!) 147/82     Pulse Rate 12/23/20 0827 72     Resp 12/23/20 0827 18     Temp 12/23/20 0827 98.3 F (36.8 C)     Temp Source 12/23/20 0827 Oral     SpO2 12/23/20 0827 98 %     Weight --      Height --      Head Circumference --      Peak Flow --      Pain Score 12/23/20 0826 5     Pain  Loc --      Pain Edu? --      Excl. in [place]? --   No data found.  Updated Vital Signs BP (!) 138/95 (BP Location: Right Arm)    Pulse 89    Temp 97.9 F (36.6 C) (Oral)    Resp 16    SpO2 100%   Physical Exam Vitals and nursing note reviewed.  Constitutional:      General: He is not in acute distress.    Appearance: Normal appearance. He is not ill-appearing.  HENT:     Head: Normocephalic and atraumatic.  Eyes:     General: Lids are normal.        Right eye: No discharge.        Left eye: No discharge.     Extraocular Movements: Extraocular movements intact.     Conjunctiva/sclera: Conjunctivae normal.     Right eye: Right conjunctiva is not injected.     Left eye: Left conjunctiva is not injected.  Neck:     Trachea: Trachea and phonation normal.  Cardiovascular:     Rate and Rhythm: Normal rate and regular rhythm.     Pulses: Normal pulses.     Heart sounds: Normal heart sounds. No murmur heard.   No friction rub. No gallop.  Pulmonary:     Effort: Pulmonary effort is normal. No  accessory muscle usage, prolonged expiration or respiratory distress.     Breath sounds: Normal breath sounds. No stridor, decreased air movement or transmitted upper airway sounds. No decreased breath sounds, wheezing, rhonchi or rales.  Chest:     Chest wall: No tenderness.  Musculoskeletal:        General: Normal range of motion.     Cervical back: Normal range of motion and neck supple. Normal range of motion.  Lymphadenopathy:     Cervical: No cervical adenopathy.  Skin:    General: Skin is warm and dry.     Findings: Lesion (Laceration on right thumb is well-healed, sutures been removed without incident.) present. No erythema or rash.  Neurological:     General: No focal deficit present.     Mental Status: He is alert and oriented to person, place, and time.  Psychiatric:        Mood and Affect: Mood normal.        Behavior: Behavior normal.    Visual Acuity Right Eye Distance:   Left Eye Distance:   Bilateral Distance:    Right Eye Near:   Left Eye Near:    Bilateral Near:     UC Couse / Diagnostics / Procedures:    EKG  Radiology No results found.  Procedures Procedures (including critical care time)  UC Diagnoses / Final Clinical Impressions(s)   I have reviewed the triage vital signs and the nursing notes.  Pertinent labs & imaging results that were available during my care of the patient were reviewed by me and considered in my medical decision making (see chart for details).    Final diagnoses:  Visit for suture removal  Encounter for medication refill   Request initiated to help patient find new PCP.  Patient advised we will be able to fill Chantix or Viagra for him today due to the need for follow-up and due to these medications not having been filled for the past year.  Sutures removed without incident.  ED Prescriptions   None    PDMP not reviewed this encounter.  Pending results:  Labs Reviewed -  No data to display  Medications Ordered in  UC: Medications - No data to display  Disposition Upon Discharge:  Condition: stable for discharge home Home: take medications as prescribed; routine discharge instructions as discussed; follow up as advised.  Patient presented with an acute illness with associated systemic symptoms and significant discomfort requiring urgent management. In my opinion, this is a condition that a prudent lay person (someone who possesses an average knowledge of health and medicine) may potentially expect to result in complications if not addressed urgently such as respiratory distress, impairment of bodily function or dysfunction of bodily organs.   Routine symptom specific, illness specific and/or disease specific instructions were discussed with the patient and/or caregiver at length.   As such, the patient has been evaluated and assessed, work-up was performed and treatment was provided in alignment with urgent care protocols and evidence based medicine.  Patient/parent/caregiver has been advised that the patient may require follow up for further testing and treatment if the symptoms continue in spite of treatment, as clinically indicated and appropriate.  If the patient was tested for COVID-19, Influenza and/or RSV, then the patient/parent/guardian was advised to isolate at home pending the results of his/her diagnostic coronavirus test and potentially longer if theyre positive. I have also advised pt that if his/her COVID-19 test returns positive, it's recommended to self-isolate for at least 10 days after symptoms first appeared AND until fever-free for 24 hours without fever reducer AND other symptoms have improved or resolved. Discussed self-isolation recommendations as well as instructions for household member/close contacts as per the [facility] and [place] DHHS, and also gave patient the [place] packet with this information.  Patient/parent/caregiver has been advised to return to the [facility] or PCP in 3-5 days if no better;  to PCP or the Emergency Department if new signs and symptoms develop, or if the current signs or symptoms continue to change or worsen for further workup, evaluation and treatment as clinically indicated and appropriate  The patient will follow up with their current PCP if and as advised. If the patient does not currently have a PCP we will assist them in obtaining one.   The patient may need specialty follow up if the symptoms continue, in spite of conservative treatment and management, for further workup, evaluation, consultation and treatment as clinically indicated and appropriate.   Patient/parent/caregiver verbalized understanding and agreement of plan as discussed.  All questions were addressed during visit.  Please see discharge instructions below for further details of plan.  Discharge Instructions:   Discharge Instructions      The laceration on your finger appears to be healing well.  You do not need to keep it covered any longer.  Unfortunately, medication such as Viagra and Chantix need to be refilled by primary care because regular follow-up of erectile dysfunction and smoking cessation was required, we cannot provide that service here at urgent care.    I have initiated a request with our PCP referral service to help you find a primary care provider.  Expect to receive a phone call from them in the next few days to get an appointment set up for you.    This office note has been dictated using Museum/gallery curator.  Unfortunately, and despite my best efforts, this method of dictation can sometimes lead to occasional typographical or grammatical errors.  I apologize in advance if this occurs.     Lynden Oxford Scales, PA-C 03/08/21 1850

## 2021-03-08 NOTE — ED Triage Notes (Signed)
Pt reports here to get suture out of right thumb. Denies any problems or s/s of infection Pt reports that since [place] is closed he is needing medications refilled of Sildenafil 100mg  and Varenicline 1mg .

## 2021-03-08 NOTE — Discharge Instructions (Signed)
The laceration on your finger appears to be healing well.  You do not need to keep it covered any longer.  Unfortunately, medication such as Viagra and Chantix need to be refilled by primary care because regular follow-up of erectile dysfunction and smoking cessation was required, we cannot provide that service here at urgent care.    I have initiated a request with our PCP referral service to help you find a primary care provider.  Expect to receive a phone call from them in the next few days to get an appointment set up for you.
